# Patient Record
Sex: Female | Born: 1945 | Smoking: Never smoker
Health system: Southern US, Community
[De-identification: ages and names within clinical notes are randomized; demographics above are authoritative.]

## PROBLEM LIST (undated history)

## (undated) DIAGNOSIS — R970 Elevated carcinoembryonic antigen [CEA]: Secondary | ICD-10-CM

## (undated) DIAGNOSIS — M109 Gout, unspecified: Secondary | ICD-10-CM

## (undated) DIAGNOSIS — C189 Malignant neoplasm of colon, unspecified: Secondary | ICD-10-CM

## (undated) DIAGNOSIS — R2242 Localized swelling, mass and lump, left lower limb: Secondary | ICD-10-CM

## (undated) HISTORY — DX: Hypercalcemia: E83.52

## (undated) HISTORY — DX: Malignant neoplasm of colon, unspecified: C18.9

## (undated) HISTORY — DX: Elevated carcinoembryonic antigen (CEA): R97.0

## (undated) HISTORY — DX: Gout, unspecified: M10.9

## (undated) HISTORY — DX: Localized swelling, mass and lump, left lower limb: R22.42

---

## 2011-10-13 ENCOUNTER — Encounter: Payer: Medicare Other | Admitting: Internal Medicine

## 2011-10-13 DIAGNOSIS — Z452 Encounter for adjustment and management of vascular access device: Secondary | ICD-10-CM

## 2011-10-13 DIAGNOSIS — C189 Malignant neoplasm of colon, unspecified: Secondary | ICD-10-CM

## 2011-11-15 ENCOUNTER — Encounter: Payer: Medicare Other | Admitting: Internal Medicine

## 2011-11-15 DIAGNOSIS — C189 Malignant neoplasm of colon, unspecified: Secondary | ICD-10-CM

## 2011-12-26 DIAGNOSIS — Z452 Encounter for adjustment and management of vascular access device: Secondary | ICD-10-CM

## 2011-12-26 DIAGNOSIS — C189 Malignant neoplasm of colon, unspecified: Secondary | ICD-10-CM

## 2012-01-22 DIAGNOSIS — M6281 Muscle weakness (generalized): Secondary | ICD-10-CM

## 2012-03-19 ENCOUNTER — Encounter: Payer: Medicare Other | Admitting: Internal Medicine

## 2012-03-19 DIAGNOSIS — C189 Malignant neoplasm of colon, unspecified: Secondary | ICD-10-CM

## 2012-03-19 DIAGNOSIS — Z452 Encounter for adjustment and management of vascular access device: Secondary | ICD-10-CM

## 2012-04-30 DIAGNOSIS — C189 Malignant neoplasm of colon, unspecified: Secondary | ICD-10-CM

## 2012-04-30 DIAGNOSIS — Z452 Encounter for adjustment and management of vascular access device: Secondary | ICD-10-CM

## 2012-06-11 DIAGNOSIS — Z452 Encounter for adjustment and management of vascular access device: Secondary | ICD-10-CM

## 2012-06-11 DIAGNOSIS — C189 Malignant neoplasm of colon, unspecified: Secondary | ICD-10-CM

## 2012-07-23 ENCOUNTER — Encounter: Payer: Medicare Other | Admitting: Internal Medicine

## 2012-07-23 DIAGNOSIS — K439 Ventral hernia without obstruction or gangrene: Secondary | ICD-10-CM

## 2012-07-23 DIAGNOSIS — C189 Malignant neoplasm of colon, unspecified: Secondary | ICD-10-CM

## 2012-09-03 DIAGNOSIS — Z452 Encounter for adjustment and management of vascular access device: Secondary | ICD-10-CM

## 2012-09-03 DIAGNOSIS — C189 Malignant neoplasm of colon, unspecified: Secondary | ICD-10-CM

## 2012-10-23 DIAGNOSIS — Z452 Encounter for adjustment and management of vascular access device: Secondary | ICD-10-CM

## 2012-10-23 DIAGNOSIS — C189 Malignant neoplasm of colon, unspecified: Secondary | ICD-10-CM

## 2012-12-04 DIAGNOSIS — Z452 Encounter for adjustment and management of vascular access device: Secondary | ICD-10-CM

## 2012-12-04 DIAGNOSIS — C189 Malignant neoplasm of colon, unspecified: Secondary | ICD-10-CM

## 2013-01-23 DIAGNOSIS — C189 Malignant neoplasm of colon, unspecified: Secondary | ICD-10-CM

## 2013-02-28 DIAGNOSIS — R9389 Abnormal findings on diagnostic imaging of other specified body structures: Secondary | ICD-10-CM

## 2013-02-28 DIAGNOSIS — Z85038 Personal history of other malignant neoplasm of large intestine: Secondary | ICD-10-CM

## 2013-02-28 DIAGNOSIS — R970 Elevated carcinoembryonic antigen [CEA]: Secondary | ICD-10-CM

## 2013-02-28 DIAGNOSIS — R944 Abnormal results of kidney function studies: Secondary | ICD-10-CM

## 2013-03-05 DIAGNOSIS — Z452 Encounter for adjustment and management of vascular access device: Secondary | ICD-10-CM

## 2013-03-05 DIAGNOSIS — C189 Malignant neoplasm of colon, unspecified: Secondary | ICD-10-CM

## 2013-03-10 ENCOUNTER — Other Ambulatory Visit (HOSPITAL_COMMUNITY): Payer: Self-pay | Admitting: Hematology and Oncology

## 2013-03-10 DIAGNOSIS — C189 Malignant neoplasm of colon, unspecified: Secondary | ICD-10-CM

## 2013-03-12 ENCOUNTER — Other Ambulatory Visit (HOSPITAL_COMMUNITY): Payer: Self-pay | Admitting: Hematology and Oncology

## 2013-03-12 ENCOUNTER — Encounter (HOSPITAL_COMMUNITY)
Admission: RE | Admit: 2013-03-12 | Discharge: 2013-03-12 | Disposition: A | Discharge status: Home / Self Care | Payer: Medicare Other | Source: Ambulatory Visit | Attending: Hematology and Oncology | Admitting: Hematology and Oncology

## 2013-03-12 DIAGNOSIS — C189 Malignant neoplasm of colon, unspecified: Secondary | ICD-10-CM | POA: Insufficient documentation

## 2013-03-12 MED ORDER — FLUDEOXYGLUCOSE F - 18 (FDG) INJECTION
17.6000 | Freq: Once | INTRAVENOUS | Status: AC | PRN
  Administered 2013-03-12: 17 via INTRAVENOUS

## 2013-03-17 DIAGNOSIS — C189 Malignant neoplasm of colon, unspecified: Secondary | ICD-10-CM

## 2013-03-17 DIAGNOSIS — R229 Localized swelling, mass and lump, unspecified: Secondary | ICD-10-CM

## 2013-03-17 DIAGNOSIS — Z23 Encounter for immunization: Secondary | ICD-10-CM

## 2013-04-24 DIAGNOSIS — Z452 Encounter for adjustment and management of vascular access device: Secondary | ICD-10-CM

## 2013-04-24 DIAGNOSIS — C189 Malignant neoplasm of colon, unspecified: Secondary | ICD-10-CM

## 2015-03-19 ENCOUNTER — Telehealth: Payer: Self-pay | Admitting: General Surgery

## 2015-03-19 NOTE — Telephone Encounter (Signed)
Called patient to give np appt [phone] no answer.

## 2015-03-22 ENCOUNTER — Telehealth: Payer: Self-pay | Admitting: General Surgery

## 2015-03-22 NOTE — Telephone Encounter (Signed)
new patient appt-s/w patient and gave np appt for 11/08 @ 3 w/Dr. Rolanda Jay

## 2015-03-23 ENCOUNTER — Other Ambulatory Visit: Payer: Medicare Other

## 2015-03-23 ENCOUNTER — Inpatient Hospital Stay (HOSPITAL_COMMUNITY)
Admission: EM | Admit: 2015-03-23 | Discharge: 2015-03-26 | DRG: 603 | Disposition: A | Discharge status: Home / Self Care | Payer: Medicare Other | Attending: Family Medicine | Admitting: Family Medicine

## 2015-03-23 ENCOUNTER — Ambulatory Visit (HOSPITAL_BASED_OUTPATIENT_CLINIC_OR_DEPARTMENT_OTHER): Payer: Medicare Other | Admitting: General Surgery

## 2015-03-23 ENCOUNTER — Other Ambulatory Visit: Payer: Self-pay | Admitting: Hematology and Oncology

## 2015-03-23 ENCOUNTER — Encounter (HOSPITAL_COMMUNITY): Payer: Self-pay | Admitting: Emergency Medicine

## 2015-03-23 ENCOUNTER — Ambulatory Visit
Admission: RE | Admit: 2015-03-23 | Discharge: 2015-03-23 | Disposition: A | Discharge status: Home / Self Care | Payer: Self-pay | Source: Ambulatory Visit | Attending: Hematology and Oncology | Admitting: Hematology and Oncology

## 2015-03-23 ENCOUNTER — Encounter: Payer: Self-pay | Admitting: General Surgery

## 2015-03-23 DIAGNOSIS — Z91013 Allergy to seafood: Secondary | ICD-10-CM

## 2015-03-23 DIAGNOSIS — L989 Disorder of the skin and subcutaneous tissue, unspecified: Secondary | ICD-10-CM

## 2015-03-23 DIAGNOSIS — L02416 Cutaneous abscess of left lower limb: Secondary | ICD-10-CM

## 2015-03-23 DIAGNOSIS — Z85038 Personal history of other malignant neoplasm of large intestine: Secondary | ICD-10-CM

## 2015-03-23 DIAGNOSIS — I8392 Asymptomatic varicose veins of left lower extremity: Secondary | ICD-10-CM | POA: Diagnosis present

## 2015-03-23 DIAGNOSIS — L0291 Cutaneous abscess, unspecified: Secondary | ICD-10-CM | POA: Diagnosis present

## 2015-03-23 DIAGNOSIS — L0390 Cellulitis, unspecified: Secondary | ICD-10-CM

## 2015-03-23 DIAGNOSIS — Z7984 Long term (current) use of oral hypoglycemic drugs: Secondary | ICD-10-CM

## 2015-03-23 DIAGNOSIS — E119 Type 2 diabetes mellitus without complications: Secondary | ICD-10-CM | POA: Diagnosis present

## 2015-03-23 DIAGNOSIS — Z6839 Body mass index (BMI) 39.0-39.9, adult: Secondary | ICD-10-CM

## 2015-03-23 DIAGNOSIS — Z7982 Long term (current) use of aspirin: Secondary | ICD-10-CM

## 2015-03-23 LAB — CBC WITH DIFFERENTIAL/PLATELET
BASOS PCT: 1 %
Basophils Absolute: 0 10*3/uL (ref 0.0–0.1)
EOS ABS: 0.4 10*3/uL (ref 0.0–0.7)
Eosinophils Relative: 5 %
HCT: 38.1 % (ref 36.0–46.0)
Hemoglobin: 12.1 g/dL (ref 12.0–15.0)
Lymphocytes Relative: 25 %
Lymphs Abs: 2.1 10*3/uL (ref 0.7–4.0)
MCH: 27.1 pg (ref 26.0–34.0)
MCHC: 31.8 g/dL (ref 30.0–36.0)
MCV: 85.2 fL (ref 78.0–100.0)
MONO ABS: 0.7 10*3/uL (ref 0.1–1.0)
MONOS PCT: 8 %
NEUTROS PCT: 61 %
Neutro Abs: 5.2 10*3/uL (ref 1.7–7.7)
PLATELETS: 497 10*3/uL — AB (ref 150–400)
RBC: 4.47 MIL/uL (ref 3.87–5.11)
RDW: 13.3 % (ref 11.5–15.5)
WBC: 8.4 10*3/uL (ref 4.0–10.5)

## 2015-03-23 LAB — I-STAT CG4 LACTIC ACID, ED: Lactic Acid, Venous: 1.86 mmol/L (ref 0.5–2.0)

## 2015-03-23 MED ORDER — SODIUM CHLORIDE 0.9 % IV BOLUS (SEPSIS)
1000.0000 mL | Freq: Once | INTRAVENOUS | Status: AC
  Administered 2015-03-23: 1000 mL via INTRAVENOUS

## 2015-03-23 MED ORDER — MORPHINE SULFATE (PF) 4 MG/ML IV SOLN
4.0000 mg | Freq: Once | INTRAVENOUS | Status: AC
  Administered 2015-03-23: 4 mg via INTRAVENOUS
  Filled 2015-03-23: qty 1

## 2015-03-23 MED ORDER — ONDANSETRON HCL 4 MG/2ML IJ SOLN
4.0000 mg | Freq: Once | INTRAMUSCULAR | Status: AC
  Administered 2015-03-23: 4 mg via INTRAVENOUS
  Filled 2015-03-23: qty 2

## 2015-03-23 MED ORDER — CLINDAMYCIN PHOSPHATE 600 MG/50ML IV SOLN
600.0000 mg | Freq: Once | INTRAVENOUS | Status: AC
  Administered 2015-03-24: 600 mg via INTRAVENOUS
  Filled 2015-03-23: qty 50

## 2015-03-23 NOTE — ED Notes (Signed)
Patient states that she has an abscess on the back of thigh on the left leg. Patient states that it has burst open. Patient states that the leg is in pain.

## 2015-03-23 NOTE — Patient Instructions (Signed)
We are recommending that you be admitted to the hospital at this time for a significant left thigh abscess.  We have concerns that the infections could turn into sepsis within a short period of time.  Please come to the Emergency Room at [facility] for further evaluation as soon as possible since you have decided to leave and go back to [place] at this time.

## 2015-03-23 NOTE — ED Notes (Signed)
Bed: JF59 Expected date:  Expected time:  Means of arrival:  Comments: HOLD FOR 26

## 2015-03-23 NOTE — Progress Notes (Signed)
[place] NOTE  Patient Care Team: Monico Blitz, MD as PCP - General (Internal Medicine)  Audree Camel, MD  CHIEF COMPLAINTS/PURPOSE OF CONSULTATION:    HISTORY OF PRESENTING ILLNESS:  Laurie Palmer 69 y.o. female is here because of recent diagnosis of a left thigh mass that has been present since 2002. It had been previously biopsied with the diagnosis of only muscle tissue at [facility]. She was then placed in follow-up. She reports this was on the anterior midportion of her thigh and was 4-5 cm in size. 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 In September she had a significant increase in swelling. 2 weeks prior she noticed dramatically bigger with her entire thigh becoming quite swollen. Last week in the left posterior distal thigh she had a skin lesion spontaneously open and begin draining clear, yellow watery discharge without odor. She did have warmth and pain of the left thigh with associated swelling around the ankle. She reports after the spontaneous drainage she had a slight decrease in the size of the mass and decrease in pain. She has had associated subjective fever and chills and the highest temperature taken at home was 99.7. She was seen by her medical oncologist who referred her here for further valuation treatment and he has started her on antibiotics which she's been on for possibly 4 days which was Augmentin. She does report that she is an insulin-dependent diabetic and that in the last week or so she's had more difficulty controlling her blood sugars.  I reviewed her records extensively and collaborated the history with the patient.  SUMMARY OF ONCOLOGIC HISTORY: Colon cancer treated with resection and adjuvant chemotherapy. We do not have the records of her stage or what drug she was treated with.  MEDICAL HISTORY:  Past Medical History  Diagnosis Date  .  Colon cancer ([place])   . Hypercalcemia   . Mass of left thigh   . Elevated carcinoembryonic antigen (CEA)     SURGICAL HISTORY: Colon resection Ventral hernia repair Tonsillectomy  SOCIAL HISTORY: Social History   Social History  . Marital Status: Widowed    Spouse Name: N/A  . Number of Children: N/A  . Years of Education: N/A   Occupational History  . Not on file.   Social History Main Topics  . Smoking status: Not on file  . Smokeless tobacco: Not on file  . Alcohol Use: No  . Drug Use: No  . Sexual Activity: Not on file   Other Topics Concern  . Not on file   Social History Narrative  . No narrative on file    FAMILY HISTORY: Family History  Problem Relation Age of Onset  . Diabetes Mother   . Hypertension Mother   . Prostate cancer Father   . Prostate cancer Brother     ALLERGIES:  is allergic to shellfish allergy.  MEDICATIONS:  Current Outpatient Prescriptions  Medication Sig Dispense Refill  . aspirin (ASPIRIN EC) 81 MG EC tablet Take 81 mg by mouth.    . canagliflozin (INVOKANA) 100 MG TABS tablet Take 100 mg by mouth.    . Cholecalciferol (VITAMIN D3) 1000 UNITS CHEW Chew 1 each by mouth.    Marland Kitchen HYDROcodone-acetaminophen (NORCO/VICODIN) 5-325 MG tablet Take 1 tablet by mouth.    Marland Kitchen ibuprofen (ADVIL,MOTRIN) 800 MG tablet Take 800 mg by mouth.    . insulin aspart (NOVOLOG) 100  UNIT/ML injection Inject into the skin.    Marland Kitchen insulin detemir (LEVEMIR) 100 UNIT/ML injection Inject 10 Units into the skin.    . metFORMIN (GLUCOPHAGE) 500 MG tablet Take 500 mg by mouth.     No current facility-administered medications for this visit.    REVIEW OF SYSTEMS:   Constitutional: She reports subjective fevers, chills  PHYSICAL EXAMINATION: ECOG PERFORMANCE STATUS:   Filed Vitals:   03/23/15 1520  BP: 124/88  Pulse: 96  Temp: 98.4 F (36.9 C)  Resp: 18   Filed Weights   03/23/15 1520  Weight: 261 lb 5 oz (118.531 kg)    GENERAL:alert, no distress and  comfortable SKIN: skin color, texture, turgor are normal, there is redness around the left thigh with associated warmth and induration in the posterior distal thigh. As an open approximately 3 cm wound with spontaneous drainage of yellowish discharge. There is only a slight odor. Musculoskeletal:no cyanosis of digits and no clubbing she does have swelling around the ankle with 1+ edema. She has full range of motion of the left lower extremity. The left thigh is markedly swollen. There is associated warmth over the entire left thigh with significant induration in the posterior distal aspect with an open draining wound as described above. There is a well-healed longitudinally oriented 5 cm scar on the left anterior mid thigh with no associated drainage.  RADIOGRAPHIC STUDIES: I have personally reviewed the radiological reports and agreed with the findings in the report.  There is evidence of inflammation and a large fluid component with peripheral enhancement consistent with possible hemosiderin. His may represent a result hematoma versus abscess. Less likely would be malignancy. I also discussed this with the radiologist.  ASSESSMENT AND PLAN:  Insulin-dependent diabetic patient with a history of left thigh mass now with obvious infection and abscess with open wound and drainage. After a  long discussion with the patient and the friend who accompanied her. I have recommended admission to the hospital with IV antibiotics and reviewed the fact that she needs open drainage and will have an open wound possibly a wound VAC. I emphasized the importance of admission particularly with the size of the abscess and the fact that she is an insulin-dependent diabetic. She has adamant that she needs to go home for personal reasons and wants to return later today or in the morning. She is pushing to come back in the morning. After extended discussion, she is still adamant that she is not going to be admitted at the present  time. I emphasized again the significant risks of sepsis and strongly encouraged her to allow [place] to admit her. She indicated she may come back this evening to the emergency room. As such, we will be notifying the emergency room to alert them of her clinical status and I will notify the surgeon on call so that they are aware of her history in case she comes to the emergency room.  All questions were answered. The patient knows to call the clinic with any problems, questions or concerns.    Frederich Cha, MD 4:03 PM

## 2015-03-23 NOTE — ED Provider Notes (Signed)
CSN: [id number]     Arrival date & time 03/23/15  2147 History  By signing my name below, I, Julien Nordmann, attest that this documentation has been prepared under the direction and in the presence of Delos Haring, PA-C. Electronically Signed: Julien Nordmann, ED Scribe. 03/23/2015. 10:43 PM.    Chief Complaint  Patient presents with  . Abscess    back of left leg     The history is provided by the patient. No language interpreter was used.   HPI Comments: Laurie Palmer is a 69 y.o. female who presents to the Emergency Department for admission as recommended by her oncologist. The patient has had a fine mass since 2012 and appears to have gotten infected recently. She was seen today by Dr. Eddie Dibbles the Oncology General Surgeon regarding the spontaneous drainage and increase in size of the mass recently was clear and yellow fluid that is nonodorous. She is also had a low-grade temp of 99.7.  Patient had been on Augmentin for the past 4 days that was given to her by her medical oncologist. She is an insulin-dependent diabetic that in the past last week or so has had difficulty controlling her blood sugars.  The patient has past medical history of colon cancer treated with resection and adjunct of chemotherapy, there are no records of staging for what drugs she is currently being treated with. Per the surgeon and patient was recommended IV antibiotics, admission and surgical open and drainage with possible wound VAC. The patient declines after significant discussion decided to go home. The patient declined adamantly admission at that time and said she would go to the emergency department either this evening or tomorrow morning. The patient presents to the ER with her sister who says that her and her siblings convinced the patient come back for treatment. The surgeon reports notifying the on-call surgeon the patient may be coming in today.  She denies any change in pain, drainage or swelling  from seeing the surgeon earlier today. She has not had worsened fevers. Denies feeling worse.   Past Medical History  Diagnosis Date  . Colon cancer ([place])   . Hypercalcemia   . Mass of left thigh   . Elevated carcinoembryonic antigen (CEA)    History reviewed. No pertinent past surgical history. Family History  Problem Relation Age of Onset  . Diabetes Mother   . Hypertension Mother   . Prostate cancer Father   . Prostate cancer Brother    Social History  Substance Use Topics  . Smoking status: None  . Smokeless tobacco: None  . Alcohol Use: No   OB History    No data available     Review of Systems  Constitutional: Negative for fever.  Gastrointestinal: Negative for nausea and vomiting.  Skin: Positive for wound.  All other systems reviewed and are negative.     Allergies  Shellfish allergy  Home Medications   Prior to Admission medications   Medication Sig Start Date End Date Taking? Authorizing Provider  aspirin (ASPIRIN EC) 81 MG EC tablet Take 81 mg by mouth.    Historical Provider, MD  canagliflozin (INVOKANA) 100 MG TABS tablet Take 100 mg by mouth.    Historical Provider, MD  Cholecalciferol (VITAMIN D3) 1000 UNITS CHEW Chew 1 each by mouth.    Historical Provider, MD  HYDROcodone-acetaminophen (NORCO/VICODIN) 5-325 MG tablet Take 1 tablet by mouth. 03/11/15 05/10/15  Historical Provider, MD  ibuprofen (ADVIL,MOTRIN) 800 MG tablet Take 800 mg by  mouth.    Historical Provider, MD  insulin aspart (NOVOLOG) 100 UNIT/ML injection Inject into the skin.    Historical Provider, MD  insulin detemir (LEVEMIR) 100 UNIT/ML injection Inject 10 Units into the skin.    Historical Provider, MD  metFORMIN (GLUCOPHAGE) 500 MG tablet Take 500 mg by mouth.    Historical Provider, MD   Triage vitals: BP 140/84 mmHg  Pulse 92  Temp(Src) 98 F (36.7 C) (Oral)  Resp 17  Ht 5' 8.5" (1.74 m)  Wt 261 lb (118.389 kg)  BMI 39.10 kg/m2  SpO2 94% Physical Exam   Constitutional: She appears well-developed and well-nourished. No distress.  HENT:  Head: Normocephalic and atraumatic.  Eyes: Right eye exhibits no discharge. Left eye exhibits no discharge.  Pulmonary/Chest: Effort normal. No respiratory distress.  Neurological: She is alert. Coordination normal.  Skin: No rash noted. She is not diaphoretic.  Left Thigh: Open 3 cm abscess draining some yellow fluid. It is warm and indurated. No red streaking. Associated swelling of the ankle leg.   Psychiatric: She has a normal mood and affect. Her behavior is normal.  Nursing note and vitals reviewed.   ED Course  Procedures  DIAGNOSTIC STUDIES: Oxygen Saturation is 94% on RA, low by my interpretation.  COORDINATION OF CARE:  10:41 PM Discussed treatment plan which includes IV fluids and pain medication with pt at bedside and pt agreed to plan.  Labs Review Labs Reviewed  CBC WITH DIFFERENTIAL/PLATELET - Abnormal; Notable for the following:    Platelets 497 (*)    All other components within normal limits  COMPREHENSIVE METABOLIC PANEL - Abnormal; Notable for the following:    Glucose, Bld 177 (*)    Calcium 10.9 (*)    Total Protein 8.4 (*)    Alkaline Phosphatase 135 (*)    All other components within normal limits  CULTURE, BLOOD (ROUTINE X 2)  CULTURE, BLOOD (ROUTINE X 2)  I-STAT CG4 LACTIC ACID, ED    Imaging Review No results found. I have personally reviewed and evaluated these images and lab results as part of my medical decision-making.   EKG Interpretation None      MDM   Final diagnoses:  Abscess and cellulitis  Abscess of left leg   I discussed the case with Dr. Hassell Done. He has agreed to consult on patient. They will either see her tonight or in the morning. Pt is not acutely ill and her blood-work is encouraging. Will admit to medicine. Lab work is reassuring, no clinical indication of sepsis at this time. Discussed with Dr. Betsey Holiday who has seen her as  well.  Patient admitted to [place], [place], inpatient.  Filed Vitals:   03/23/15 2204  BP: 140/84  Pulse: 92  Temp: 98 F (36.7 C)  Resp: [address] PA-C 03/24/15 0101  Orpah Greek, MD 03/24/15 0104

## 2015-03-24 ENCOUNTER — Encounter (HOSPITAL_COMMUNITY): Payer: Self-pay | Admitting: *Deleted

## 2015-03-24 DIAGNOSIS — E119 Type 2 diabetes mellitus without complications: Secondary | ICD-10-CM | POA: Diagnosis present

## 2015-03-24 DIAGNOSIS — Z6839 Body mass index (BMI) 39.0-39.9, adult: Secondary | ICD-10-CM | POA: Diagnosis not present

## 2015-03-24 DIAGNOSIS — Z91013 Allergy to seafood: Secondary | ICD-10-CM | POA: Diagnosis not present

## 2015-03-24 DIAGNOSIS — Z7984 Long term (current) use of oral hypoglycemic drugs: Secondary | ICD-10-CM | POA: Diagnosis not present

## 2015-03-24 DIAGNOSIS — L0291 Cutaneous abscess, unspecified: Secondary | ICD-10-CM | POA: Diagnosis present

## 2015-03-24 DIAGNOSIS — Z85038 Personal history of other malignant neoplasm of large intestine: Secondary | ICD-10-CM | POA: Diagnosis not present

## 2015-03-24 DIAGNOSIS — L02416 Cutaneous abscess of left lower limb: Principal | ICD-10-CM

## 2015-03-24 DIAGNOSIS — I8392 Asymptomatic varicose veins of left lower extremity: Secondary | ICD-10-CM | POA: Diagnosis present

## 2015-03-24 DIAGNOSIS — Z7982 Long term (current) use of aspirin: Secondary | ICD-10-CM | POA: Diagnosis not present

## 2015-03-24 LAB — COMPREHENSIVE METABOLIC PANEL
ALBUMIN: 3.6 g/dL (ref 3.5–5.0)
ALT: 18 U/L (ref 14–54)
ANION GAP: 9 (ref 5–15)
AST: 18 U/L (ref 15–41)
Alkaline Phosphatase: 135 U/L — ABNORMAL HIGH (ref 38–126)
BUN: 12 mg/dL (ref 6–20)
CO2: 26 mmol/L (ref 22–32)
Calcium: 10.9 mg/dL — ABNORMAL HIGH (ref 8.9–10.3)
Chloride: 104 mmol/L (ref 101–111)
Creatinine, Ser: 0.7 mg/dL (ref 0.44–1.00)
GFR calc Af Amer: >60 mL/min (ref >60)
GFR calc non Af Amer: >60 mL/min (ref >60)
GLUCOSE: 177 mg/dL — AB (ref 65–99)
POTASSIUM: 3.7 mmol/L (ref 3.5–5.1)
SODIUM: 139 mmol/L (ref 135–145)
Total Bilirubin: 0.5 mg/dL (ref 0.3–1.2)
Total Protein: 8.4 g/dL — ABNORMAL HIGH (ref 6.5–8.1)

## 2015-03-24 LAB — GLUCOSE, CAPILLARY
GLUCOSE-CAPILLARY: 116 mg/dL — AB (ref 65–99)
GLUCOSE-CAPILLARY: 168 mg/dL — AB (ref 65–99)
Glucose-Capillary: 130 mg/dL — ABNORMAL HIGH (ref 65–99)
Glucose-Capillary: 132 mg/dL — ABNORMAL HIGH (ref 65–99)

## 2015-03-24 MED ORDER — ENOXAPARIN SODIUM 40 MG/0.4ML ~~LOC~~ SOLN: 40.0000 mg | SUBCUTANEOUS | Status: DC

## 2015-03-24 MED ORDER — INSULIN ASPART 100 UNIT/ML ~~LOC~~ SOLN
0.0000 [IU] | Freq: Three times a day (TID) | SUBCUTANEOUS | Status: DC
  Administered 2015-03-24 – 2015-03-26 (×6): 1 [IU] via SUBCUTANEOUS

## 2015-03-24 MED ORDER — INFLUENZA VAC SPLIT QUAD 0.5 ML IM SUSY: 0.5000 mL | PREFILLED_SYRINGE | INTRAMUSCULAR | Status: DC | PRN

## 2015-03-24 MED ORDER — VANCOMYCIN HCL IN DEXTROSE 1-5 GM/200ML-% IV SOLN
1000.0000 mg | Freq: Two times a day (BID) | INTRAVENOUS | Status: DC
  Administered 2015-03-24 – 2015-03-25 (×2): 1000 mg via INTRAVENOUS
  Filled 2015-03-24 (×3): qty 200

## 2015-03-24 MED ORDER — VANCOMYCIN HCL IN DEXTROSE 750-5 MG/150ML-% IV SOLN
750.0000 mg | Freq: Two times a day (BID) | INTRAVENOUS | Status: DC
  Filled 2015-03-24: qty 150

## 2015-03-24 MED ORDER — ENOXAPARIN SODIUM 60 MG/0.6ML ~~LOC~~ SOLN
60.0000 mg | Freq: Every day | SUBCUTANEOUS | Status: DC
  Administered 2015-03-24: 60 mg via SUBCUTANEOUS
  Filled 2015-03-24: qty 0.6

## 2015-03-24 MED ORDER — MORPHINE SULFATE (PF) 2 MG/ML IV SOLN
1.0000 mg | INTRAVENOUS | Status: DC | PRN
  Administered 2015-03-24 – 2015-03-25 (×4): 1 mg via INTRAVENOUS
  Filled 2015-03-24 (×4): qty 1

## 2015-03-24 MED ORDER — VANCOMYCIN HCL 10 G IV SOLR
2500.0000 mg | Freq: Once | INTRAVENOUS | Status: AC
  Administered 2015-03-24: 2500 mg via INTRAVENOUS
  Filled 2015-03-24 (×2): qty 2500

## 2015-03-24 MED ORDER — ASPIRIN EC 81 MG PO TBEC
81.0000 mg | DELAYED_RELEASE_TABLET | Freq: Every day | ORAL | Status: DC
  Administered 2015-03-24: 81 mg via ORAL
  Filled 2015-03-24: qty 1

## 2015-03-24 MED ORDER — HYDROMORPHONE HCL 1 MG/ML IJ SOLN
0.5000 mg | INTRAMUSCULAR | Status: AC | PRN
Start: 2015-03-24 — End: 2015-03-24

## 2015-03-24 MED ORDER — PIPERACILLIN-TAZOBACTAM 3.375 G IVPB
3.3750 g | Freq: Once | INTRAVENOUS | Status: AC
  Administered 2015-03-24: 3.375 g via INTRAVENOUS
  Filled 2015-03-24: qty 50

## 2015-03-24 MED ORDER — INSULIN DETEMIR 100 UNIT/ML ~~LOC~~ SOLN
10.0000 [IU] | Freq: Every day | SUBCUTANEOUS | Status: DC
  Administered 2015-03-24 – 2015-03-25 (×2): 10 [IU] via SUBCUTANEOUS
  Filled 2015-03-24 (×4): qty 0.1

## 2015-03-24 MED ORDER — PIPERACILLIN-TAZOBACTAM 3.375 G IVPB
3.3750 g | Freq: Three times a day (TID) | INTRAVENOUS | Status: DC
Start: 2015-03-24 — End: 2015-03-26
  Administered 2015-03-24 – 2015-03-26 (×6): 3.375 g via INTRAVENOUS
  Filled 2015-03-24 (×8): qty 50

## 2015-03-24 NOTE — Progress Notes (Signed)
Utilization review completed.  Awaiting surgical consult

## 2015-03-24 NOTE — Progress Notes (Signed)
Brief Pharmacy Note:  Pharmacy Consult for Zosyn/Vancomycin Indication: Wound infection  See note by Lawana Pai, RPh, for full details. In brief, this is a 42 y/oF currently being treated with Vancomycin and Zosyn for left thigh abscess. Patient is scheduled for I&D of abscess with biopsies on 11/10.  Plan:  Increase maintenance dose of Vancomycin to 1g IV q12h for updated weight per nomogram.  Plan for Vancomycin trough level at steady state.  Continue Zosyn 3.375g IV q8h (infuse over 4 hours)  Monitor renal function, cultures, clinical course.    Lindell Spar, PharmD, BCPS Pager: [phone] 03/24/2015 3:36 PM

## 2015-03-24 NOTE — Progress Notes (Signed)
  3:46 PM I agree with HPI/GPe and A/P per Dr. 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 Abscess 03/24/2015  . Abscess of left leg 03/23/2015   Appreciate surgery inoput NPO after midnight PRe-op orders as per Dr. Rolanda Jay We will follow and assist as needed  Verneita Griffes, MD [facility] (P) [phone]

## 2015-03-24 NOTE — Progress Notes (Addendum)
ANTIBIOTIC CONSULT NOTE - INITIAL  Pharmacy Consult for Zosyn/Vancomycin Indication: Wound infection  Allergies  Allergen Reactions  . Shellfish Allergy Swelling and Anaphylaxis    Patient Measurements: Height: 5' 8.5" (174 cm) Weight: 261 lb (118.389 kg) IBW/kg (Calculated) : 65.05   Vital Signs: Temp: 98 F (36.7 C) (11/08 2204) Temp Source: Oral (11/08 2204) BP: 112/63 mmHg (11/09 0122) Pulse Rate: 77 (11/09 0122) Intake/Output from previous day:   Intake/Output from this shift:    Labs:  Recent Labs  03/23/15 2314  WBC 8.4  HGB 12.1  PLT 497*  CREATININE 0.70   Estimated Creatinine Clearance: 90.5 mL/min (by C-G formula based on Cr of 0.7). No results for input(s): VANCOTROUGH, VANCOPEAK, VANCORANDOM, GENTTROUGH, GENTPEAK, GENTRANDOM, TOBRATROUGH, TOBRAPEAK, TOBRARND, AMIKACINPEAK, AMIKACINTROU, AMIKACIN in the last 72 hours.   Microbiology: No results found for this or any previous visit (from the past 720 hour(s)).  Medical History: Past Medical History  Diagnosis Date  . Colon cancer ([place])   . Hypercalcemia   . Mass of left thigh   . Elevated carcinoembryonic antigen (CEA)     Medications:   (Not in a hospital admission) Scheduled:  . aspirin  81 mg Oral Daily  . enoxaparin (LOVENOX) injection  40 mg Subcutaneous Q24H  . insulin aspart  0-9 Units Subcutaneous TID WC  . insulin detemir  10 Units Subcutaneous QHS   Infusions:  . piperacillin-tazobactam (ZOSYN)  IV    . vancomycin     Assessment: 89 yoF c/o pain in left thigh.  Left posterior thigh fluid collection concerning for possible abscess.  Zosyn/Vancomycin per Rx for wound infection.   Goal of Therapy:  Vancomycin trough level 15-20 mcg/ml  Plan:   Zosyn 3.375 Gm IV q8h EI  Vancomycin 2500mg  x1 then 750mg  IV q12h  Adjust Lovenox to 60mg  daily in pt with BMI>30  F/u SCr/cultures/levels as needed  Lawana Pai R 03/24/2015,2:18 AM

## 2015-03-24 NOTE — ED Notes (Signed)
Tried to call report. RN unavailable. 

## 2015-03-24 NOTE — H&P (Signed)
History and Physical  JANEAL ABADI EPP:[id number] DOB: Jan 13, 1946 DOA: 03/23/2015  PCP: Monico Blitz, MD   Chief Complaint: pain in left thigh  History of Present Illness:  - Patient is a 69 yo female with history of DM, colon cancer and a persistent left thigh mass since 2002.  - She has been having increase in the size of the mass since Feb with no fever, chills, fatigue, weight loss or any other symptoms except for occasional pain in the thigh posteriorly. Only last weekend it started oozing through the skin.  - She denies history of trauma or poking to the site. She was given Augmentin last week after she had MRI of left thigh that suggested a "fluid collection" that was worrisome of an abscess. Then she had spontaneous drainage last weekend. Today she had dressing applied to it in the cancer center. Dr.Darovsky tried to convince patient to come to ER to be seen by surgery but initially she refused then she came back last evening.  - She is complaining of pain at the site and oozing with no other complaints. She could not tell if the skin was erythematous but she felt it was warm to touch. She denies any joints pain, including left knee pain.  - I don't have access to MRI results.   Review of Systems:  CONSTITUTIONAL:  No night sweats.  No fatigue, malaise, lethargy.  No fever or chills. Eyes:  No visual changes.  No eye pain.  No eye discharge.   ENT:    No epistaxis.  No sinus pain.  No sore throat.  No ear pain.  No congestion. RESPIRATORY:  No cough.  No wheeze.  No hemoptysis.  No shortness of breath. CARDIOVASCULAR:  No chest pains.  No palpitations. GASTROINTESTINAL:  No abdominal pain.  No nausea or vomiting.  No diarrhea or constipation.  No hematemesis.  No hematochezia.  No melena. GENITOURINARY:  No urgency.  No frequency.  No dysuria.  No hematuria.  No obstructive symptoms.  No discharge.  No pain.  No significant abnormal bleeding. MUSCULOSKELETAL:   +musculoskeletal pain.  No joint swelling.  No arthritis. NEUROLOGICAL:  No confusion.  No weakness. No headache. No seizure. PSYCHIATRIC:  No depression. No anxiety. No suicidal ideation. SKIN:  No rashes.  +lesions.  +wounds. ENDOCRINE:  No unexplained weight loss.  No polydipsia.  No polyuria.  No polyphagia. HEMATOLOGIC:  No anemia.  No purpura.  No petechiae.  No bleeding.  ALLERGIC AND IMMUNOLOGIC:  No pruritus.  No swelling Other:  Past Medical and Surgical History:   Past Medical History  Diagnosis Date  . Colon cancer ([place])   . Hypercalcemia   . Mass of left thigh   . Elevated carcinoembryonic antigen (CEA)    History reviewed. No pertinent past surgical history.  Social History:   reports that she does not drink alcohol or use illicit drugs. Her tobacco history is not on file.   Allergies  Allergen Reactions  . Shellfish Allergy Swelling and Anaphylaxis    Family History  Problem Relation Age of Onset  . Diabetes Mother   . Hypertension Mother   . Prostate cancer Father   . Prostate cancer Brother       Prior to Admission medications   Medication Sig Start Date End Date Taking? Authorizing Provider  aspirin (ASPIRIN EC) 81 MG EC tablet Take 81 mg by mouth.    Historical Provider, MD  canagliflozin (INVOKANA) 100 MG TABS tablet Take 100 mg by  mouth.    Historical Provider, MD  Cholecalciferol (VITAMIN D3) 1000 UNITS CHEW Chew 1 each by mouth.    Historical Provider, MD  HYDROcodone-acetaminophen (NORCO/VICODIN) 5-325 MG tablet Take 1 tablet by mouth. 03/11/15 05/10/15  Historical Provider, MD  ibuprofen (ADVIL,MOTRIN) 800 MG tablet Take 800 mg by mouth.    Historical Provider, MD  insulin aspart (NOVOLOG) 100 UNIT/ML injection Inject into the skin.    Historical Provider, MD  insulin detemir (LEVEMIR) 100 UNIT/ML injection Inject 10 Units into the skin.    Historical Provider, MD  metFORMIN (GLUCOPHAGE) 500 MG tablet Take 500 mg by mouth.    Historical  Provider, MD    Physical Exam: BP 140/84 mmHg  Pulse 92  Temp(Src) 98 F (36.7 C) (Oral)  Resp 17  Ht 5' 8.5" (1.74 m)  Wt 118.389 kg (261 lb)  BMI 39.10 kg/m2  SpO2 94%  GENERAL : Well developed, well nourished, alert and cooperative, and appears to be in no acute distress. HEAD: normocephalic. EYES: PERRL, EOMI..vision is grossly intact. EARS:  hearing grossly intact. NOSE: No nasal discharge. THROAT: Oral cavity and pharynx normal.   NECK: Neck supple, CARDIAC: Normal S1 and S2. No S3, S4 or murmurs. Rhythm is regular. There is no peripheral edema, cyanosis or pallor. Extremities are warm and well perfused. No carotid bruits. LUNGS: Clear to auscultation and percussion without rales, rhonchi, wheezing or diminished breath sounds. ABDOMEN: Positive bowel sounds. Soft, nondistended, nontender. No guarding or rebound. No masses. EXTREMITIES:  NEUROLOGICAL: The mental examination revealed the patient was oriented to person, place, and time.CN II-XII intact. Strength and sensation symmetric and intact throughout.  SKIN: erythematous skin with 5*4 cm wound oozing clear fluid with white tissue protruding. No tenderness to touch.  PSYCHIATRIC:  The patient was able to demonstrate good judgement and reason, without hallucinations, abnormal affect or abnormal behaviors during the examination. Patient is not suicidal.          Labs on Admission:  Reviewed.   Radiological Exams on Admission: No results found.    Assessment/Plan  Left posterior thigh fluid collection concerning for a possible abscess:  Will start vanc/zosyn , send for bcx. ( she got clindamycin in the ER) Will check MRI report done as an outpatient tomorrow Consult general surgery in am for I&D Wound care consult PT consult Morphine prn pain.   DM: continue Levemir and start low dose correction insulin. Hold PO med for now   DVT prophylaxis: Roberts enoxaparin  Consultants: Gen surgery  Code Status: full      Gennaro Africa M.D [facility]

## 2015-03-24 NOTE — Care Management Note (Signed)
Case Management Note  Patient Details  Name: Laurie Palmer MRN: [id number] Date of Birth: [birth year]  Subjective/Objective:         69 yo admitted with Abscess of Left leg           Action/Plan: From home alone  Expected Discharge Date:                  Expected Discharge Plan:  [place]  In-House Referral:     Discharge planning Services  CM Consult  Post Acute Care Choice:  Home Health Choice offered to:  Patient  DME Arranged:  Vac DME Agency:     HH Arranged:    HH Agency:     Status of Service:  In process, will continue to follow  Medicare Important Message Given:    Date Medicare IM Given:    Medicare IM give by:    Date Additional Medicare IM Given:    Additional Medicare Important Message give by:     If discussed at [place] of Stay Meetings, dates discussed:    Additional Comments: MD consult for home health needs. Pt to have VAC placed tomorrow. This CM met with pt at bedside to offer choice for [facility] for home VAC dressing changes. Pt from [facility]. Pt would like to look over [facility] Provider list and think about decision. Wound VAC dressing order form placed on shadow chart for MD signature. Once signature is done CM will fax order to [facility] rep. Staff RN informed that Vac order form needs to be signed by MD. CM will continue to follow. Lynnell Catalan, RN 03/24/2015, 3:26 PM

## 2015-03-24 NOTE — Consult Note (Signed)
Patient well-known to me, she was seen yesterday in clinic and diagnosed with a spontaneously draining left thigh abscess. She declined admission at that point and needed to return home. She is now been admitted and started on antibiotics. She has been on a diet. I discussed at length with the hospitalist who agrees that this can be handled here and no other consults are necessary at this point. I reviewed this with the patient she understands and agrees. She to would prefer to be treated here. As such we will plan to make her nothing by mouth after midnight. Hold her next dose of Lovenox. We will plan to take her to the operating room tomorrow for incision and drainage of left thigh abscess with biopsies. We will ultimately place a wound VAC. She understands this will require prolonged period of recuperation and wound VAC changes. Risk and benefits were reviewed at length including bleeding infection and need for further surgery. She also understands the possibility this could be malignant in which case she may require additional resection and/or radiation therapy.

## 2015-03-25 ENCOUNTER — Encounter (HOSPITAL_COMMUNITY): Payer: Self-pay | Admitting: Anesthesiology

## 2015-03-25 ENCOUNTER — Encounter (HOSPITAL_COMMUNITY)
Admission: EM | Disposition: A | Discharge status: Home / Self Care | Payer: Self-pay | Source: Home / Self Care | Attending: Family Medicine

## 2015-03-25 ENCOUNTER — Inpatient Hospital Stay (HOSPITAL_COMMUNITY): Payer: Medicare Other | Admitting: Anesthesiology

## 2015-03-25 DIAGNOSIS — L02416 Cutaneous abscess of left lower limb: Secondary | ICD-10-CM | POA: Diagnosis not present

## 2015-03-25 HISTORY — PX: IRRIGATION AND DEBRIDEMENT ABSCESS: SHX5252

## 2015-03-25 LAB — BASIC METABOLIC PANEL
ANION GAP: 5 (ref 5–15)
BUN: 9 mg/dL (ref 6–20)
CALCIUM: 10 mg/dL (ref 8.9–10.3)
CO2: 30 mmol/L (ref 22–32)
CREATININE: 0.66 mg/dL (ref 0.44–1.00)
Chloride: 102 mmol/L (ref 101–111)
Glucose, Bld: 135 mg/dL — ABNORMAL HIGH (ref 65–99)
Potassium: 4.2 mmol/L (ref 3.5–5.1)
SODIUM: 137 mmol/L (ref 135–145)

## 2015-03-25 LAB — CBC
HEMATOCRIT: 38.1 % (ref 36.0–46.0)
Hemoglobin: 11.8 g/dL — ABNORMAL LOW (ref 12.0–15.0)
MCH: 27 pg (ref 26.0–34.0)
MCHC: 31 g/dL (ref 30.0–36.0)
MCV: 87.2 fL (ref 78.0–100.0)
Platelets: 454 10*3/uL — ABNORMAL HIGH (ref 150–400)
RBC: 4.37 MIL/uL (ref 3.87–5.11)
RDW: 13.7 % (ref 11.5–15.5)
WBC: 7.4 10*3/uL (ref 4.0–10.5)

## 2015-03-25 LAB — GLUCOSE, CAPILLARY
GLUCOSE-CAPILLARY: 126 mg/dL — AB (ref 65–99)
GLUCOSE-CAPILLARY: 150 mg/dL — AB (ref 65–99)
Glucose-Capillary: 120 mg/dL — ABNORMAL HIGH (ref 65–99)
Glucose-Capillary: 129 mg/dL — ABNORMAL HIGH (ref 65–99)
Glucose-Capillary: 158 mg/dL — ABNORMAL HIGH (ref 65–99)

## 2015-03-25 LAB — SURGICAL PCR SCREEN
MRSA, PCR: POSITIVE — AB
STAPHYLOCOCCUS AUREUS: POSITIVE — AB

## 2015-03-25 SURGERY — IRRIGATION AND DEBRIDEMENT ABSCESS
Anesthesia: General | Site: Thigh | Laterality: Left

## 2015-03-25 MED ORDER — MUPIROCIN 2 % EX OINT
1.0000 "application " | TOPICAL_OINTMENT | Freq: Two times a day (BID) | CUTANEOUS | Status: DC
  Administered 2015-03-25 – 2015-03-26 (×2): 1 via NASAL

## 2015-03-25 MED ORDER — OXYCODONE HCL 5 MG PO TABS: 5.0000 mg | ORAL_TABLET | Freq: Once | ORAL | Status: DC | PRN

## 2015-03-25 MED ORDER — FENTANYL CITRATE (PF) 100 MCG/2ML IJ SOLN
INTRAMUSCULAR | Status: DC | PRN
  Administered 2015-03-25: 50 ug via INTRAVENOUS
  Administered 2015-03-25: 150 ug via INTRAVENOUS
  Administered 2015-03-25: 50 ug via INTRAVENOUS

## 2015-03-25 MED ORDER — METOCLOPRAMIDE HCL 5 MG/ML IJ SOLN
INTRAMUSCULAR | Status: DC | PRN
  Administered 2015-03-25: 10 mg via INTRAVENOUS

## 2015-03-25 MED ORDER — METOCLOPRAMIDE HCL 5 MG/ML IJ SOLN
INTRAMUSCULAR | Status: AC
  Filled 2015-03-25: qty 2

## 2015-03-25 MED ORDER — PROPOFOL 10 MG/ML IV BOLUS
INTRAVENOUS | Status: DC | PRN
  Administered 2015-03-25: 140 mg via INTRAVENOUS

## 2015-03-25 MED ORDER — HYDROMORPHONE HCL 1 MG/ML IJ SOLN
0.2500 mg | INTRAMUSCULAR | Status: DC | PRN
  Administered 2015-03-25 (×4): 0.5 mg via INTRAVENOUS

## 2015-03-25 MED ORDER — CHLORHEXIDINE GLUCONATE CLOTH 2 % EX PADS
6.0000 | MEDICATED_PAD | Freq: Every day | CUTANEOUS | Status: DC
  Administered 2015-03-25 – 2015-03-26 (×2): 6 via TOPICAL

## 2015-03-25 MED ORDER — LACTATED RINGERS IV SOLN
INTRAVENOUS | Status: DC
  Administered 2015-03-25 (×2): via INTRAVENOUS
  Administered 2015-03-25: 1000 mL via INTRAVENOUS

## 2015-03-25 MED ORDER — LIDOCAINE HCL (CARDIAC) 20 MG/ML IV SOLN
INTRAVENOUS | Status: DC | PRN
  Administered 2015-03-25: 50 mg via INTRAVENOUS

## 2015-03-25 MED ORDER — SUGAMMADEX SODIUM 500 MG/5ML IV SOLN
INTRAVENOUS | Status: DC | PRN
  Administered 2015-03-25: 300 mg via INTRAVENOUS

## 2015-03-25 MED ORDER — PROPOFOL 10 MG/ML IV BOLUS
INTRAVENOUS | Status: AC
  Filled 2015-03-25: qty 20

## 2015-03-25 MED ORDER — ENOXAPARIN SODIUM 60 MG/0.6ML ~~LOC~~ SOLN
60.0000 mg | SUBCUTANEOUS | Status: DC
  Administered 2015-03-25: 60 mg via SUBCUTANEOUS
  Filled 2015-03-25 (×2): qty 0.6

## 2015-03-25 MED ORDER — HYDROMORPHONE HCL 1 MG/ML IJ SOLN
INTRAMUSCULAR | Status: AC
  Filled 2015-03-25: qty 1

## 2015-03-25 MED ORDER — ROCURONIUM BROMIDE 100 MG/10ML IV SOLN
INTRAVENOUS | Status: DC | PRN
  Administered 2015-03-25: 20 mg via INTRAVENOUS

## 2015-03-25 MED ORDER — SUGAMMADEX SODIUM 200 MG/2ML IV SOLN
INTRAVENOUS | Status: AC
  Filled 2015-03-25: qty 2

## 2015-03-25 MED ORDER — FENTANYL CITRATE (PF) 250 MCG/5ML IJ SOLN
INTRAMUSCULAR | Status: AC
Start: 2015-03-25 — End: 2015-03-25
  Filled 2015-03-25: qty 25

## 2015-03-25 MED ORDER — ACETAMINOPHEN 325 MG PO TABS: 325.0000 mg | ORAL_TABLET | ORAL | Status: DC | PRN

## 2015-03-25 MED ORDER — 0.9 % SODIUM CHLORIDE (POUR BTL) OPTIME
TOPICAL | Status: DC | PRN
Start: 2015-03-25 — End: 2015-03-25
  Administered 2015-03-25: 1000 mL

## 2015-03-25 MED ORDER — ONDANSETRON HCL 4 MG/2ML IJ SOLN
INTRAMUSCULAR | Status: AC
  Filled 2015-03-25: qty 2

## 2015-03-25 MED ORDER — SUCCINYLCHOLINE CHLORIDE 20 MG/ML IJ SOLN
INTRAMUSCULAR | Status: DC | PRN
  Administered 2015-03-25: 100 mg via INTRAVENOUS

## 2015-03-25 MED ORDER — MUPIROCIN 2 % EX OINT
TOPICAL_OINTMENT | CUTANEOUS | Status: AC
  Administered 2015-03-25: 10:00:00
  Filled 2015-03-25: qty 22

## 2015-03-25 MED ORDER — ACETAMINOPHEN 160 MG/5ML PO SOLN: 325.0000 mg | ORAL | Status: DC | PRN

## 2015-03-25 MED ORDER — OXYCODONE-ACETAMINOPHEN 5-325 MG PO TABS
2.0000 | ORAL_TABLET | ORAL | Status: DC | PRN
Start: 2015-03-25 — End: 2015-03-26

## 2015-03-25 MED ORDER — ONDANSETRON HCL 4 MG/2ML IJ SOLN
INTRAMUSCULAR | Status: DC | PRN
  Administered 2015-03-25: 4 mg via INTRAVENOUS

## 2015-03-25 MED ORDER — PHENYLEPHRINE 40 MCG/ML (10ML) SYRINGE FOR IV PUSH (FOR BLOOD PRESSURE SUPPORT)
PREFILLED_SYRINGE | INTRAVENOUS | Status: AC
  Filled 2015-03-25: qty 10

## 2015-03-25 MED ORDER — OXYCODONE HCL 5 MG/5ML PO SOLN: 5.0000 mg | Freq: Once | ORAL | Status: DC | PRN

## 2015-03-25 MED ORDER — PHENYLEPHRINE HCL 10 MG/ML IJ SOLN
INTRAMUSCULAR | Status: DC | PRN
  Administered 2015-03-25: 80 ug via INTRAVENOUS

## 2015-03-25 MED ORDER — SODIUM CHLORIDE 0.9 % IR SOLN
Status: DC | PRN
  Administered 2015-03-25: 3000 mL

## 2015-03-25 SURGICAL SUPPLY — 39 items
BENZOIN TINCTURE PRP APPL 2/3 (GAUZE/BANDAGES/DRESSINGS) IMPLANT
BLADE HEX COATED 2.75 (ELECTRODE) ×3 IMPLANT
BLADE SURG 15 STRL LF DISP TIS (BLADE) ×1 IMPLANT
BLADE SURG 15 STRL SS (BLADE) ×2
BLADE SURG SZ10 CARB STEEL (BLADE) ×3 IMPLANT
CLOSURE WOUND 1/2 X4 (GAUZE/BANDAGES/DRESSINGS)
COVER SURGICAL LIGHT HANDLE (MISCELLANEOUS) ×3 IMPLANT
DECANTER SPIKE VIAL GLASS SM (MISCELLANEOUS) IMPLANT
DRAPE LAPAROSCOPIC ABDOMINAL (DRAPES) ×3 IMPLANT
DRAPE LAPAROTOMY T 102X78X121 (DRAPES) IMPLANT
DRAPE LAPAROTOMY TRNSV 102X78 (DRAPE) IMPLANT
DRAPE POUCH INSTRU U-SHP 10X18 (DRAPES) IMPLANT
DRAPE SHEET LG 3/4 BI-LAMINATE (DRAPES) IMPLANT
ELECT PENCIL ROCKER SW 15FT (MISCELLANEOUS) ×6 IMPLANT
ELECT REM PT RETURN 9FT ADLT (ELECTROSURGICAL) ×3
ELECTRODE REM PT RTRN 9FT ADLT (ELECTROSURGICAL) ×1 IMPLANT
EVACUATOR SILICONE 100CC (DRAIN) IMPLANT
GAUZE SPONGE 4X4 12PLY STRL (GAUZE/BANDAGES/DRESSINGS) ×6 IMPLANT
GLOVE BIOGEL PI IND STRL 7.0 (GLOVE) ×1 IMPLANT
GLOVE BIOGEL PI INDICATOR 7.0 (GLOVE) ×2
GLOVE EUDERMIC 7 POWDERFREE (GLOVE) ×3 IMPLANT
GOWN STRL REUS W/TWL LRG LVL3 (GOWN DISPOSABLE) ×3 IMPLANT
GOWN STRL REUS W/TWL XL LVL3 (GOWN DISPOSABLE) ×6 IMPLANT
KIT BASIN OR (CUSTOM PROCEDURE TRAY) ×3 IMPLANT
NEEDLE HYPO 25X1 1.5 SAFETY (NEEDLE) IMPLANT
NS IRRIG 1000ML POUR BTL (IV SOLUTION) IMPLANT
PACK BASIC VI WITH GOWN DISP (CUSTOM PROCEDURE TRAY) ×3 IMPLANT
PEN SKIN MARKING BROAD (MISCELLANEOUS) IMPLANT
SOL PREP POV-IOD 4OZ 10% (MISCELLANEOUS) ×3 IMPLANT
SPONGE LAP 18X18 X RAY DECT (DISPOSABLE) ×3 IMPLANT
SPONGE LAP 4X18 X RAY DECT (DISPOSABLE) ×3 IMPLANT
STAPLER VISISTAT 35W (STAPLE) IMPLANT
STRIP CLOSURE SKIN 1/2X4 (GAUZE/BANDAGES/DRESSINGS) IMPLANT
SUT MNCRL AB 4-0 PS2 18 (SUTURE) IMPLANT
SUT VIC AB 3-0 SH 18 (SUTURE) IMPLANT
SYR CONTROL 10ML LL (SYRINGE) ×3 IMPLANT
TOWEL OR 17X26 10 PK STRL BLUE (TOWEL DISPOSABLE) ×3 IMPLANT
WATER STERILE IRR 1500ML POUR (IV SOLUTION) IMPLANT
YANKAUER SUCT BULB TIP 10FT TU (MISCELLANEOUS) IMPLANT

## 2015-03-25 NOTE — Progress Notes (Signed)
Pt seen this am.  NPO since yest 6pm. No new complaints. All questions answered. Plan I&D today.

## 2015-03-25 NOTE — Interval H&P Note (Signed)
History and Physical Interval Note:  03/25/2015 10:24 AM  Laurie Palmer  has presented today for surgery, with the diagnosis of abcess left thigh  The various methods of treatment have been discussed with the patient and family. After consideration of risks, benefits and other options for treatment, the patient has consented to  Procedure(s): IRRIGATION AND DEBRIDEMENT ABSCESS LEFT THIGH WITH BIOPSIES AND WOUND [name] (Left) as a surgical intervention .  The patient's history has been reviewed, patient examined, no change in status, stable for surgery.  I have reviewed the patient's chart and labs.  Questions were answered to the patient's satisfaction.     Frederich Cha

## 2015-03-25 NOTE — Anesthesia Preprocedure Evaluation (Signed)
Anesthesia Evaluation  Patient identified by MRN, date of birth, ID band Patient awake    Reviewed: Allergy & Precautions, NPO status , Patient's Chart, lab work & pertinent test results  History of Anesthesia Complications Negative for: history of anesthetic complications  Airway Mallampati: I  TM Distance: >3 FB Neck ROM: Full    Dental  (+) Teeth Intact, Missing   Pulmonary neg pulmonary ROS,    breath sounds clear to auscultation       Cardiovascular negative cardio ROS   Rhythm:Regular     Neuro/Psych negative neurological ROS  negative psych ROS   GI/Hepatic negative GI ROS, Neg liver ROS,   Endo/Other  diabetes, Type 2, Insulin DependentMorbid obesity  Renal/GU negative Renal ROS     Musculoskeletal   Abdominal   Peds  Hematology negative hematology ROS (+)   Anesthesia Other Findings   Reproductive/Obstetrics                             Anesthesia Physical Anesthesia Plan  ASA: III  Anesthesia Plan: General   Post-op Pain Management:    Induction: Intravenous  Airway Management Planned: Oral ETT  Additional Equipment: None  Intra-op Plan:   Post-operative Plan: Extubation in OR  Informed Consent: I have reviewed the patients History and Physical, chart, labs and discussed the procedure including the risks, benefits and alternatives for the proposed anesthesia with the patient or authorized representative who has indicated his/her understanding and acceptance.   Dental advisory given  Plan Discussed with: CRNA and Surgeon  Anesthesia Plan Comments:         Anesthesia Quick Evaluation

## 2015-03-25 NOTE — Anesthesia Postprocedure Evaluation (Signed)
  Anesthesia Post-op Note  Patient: Laurie Palmer  Procedure(s) Performed: Procedure(s): IRRIGATION AND DEBRIDEMENT ABSCESS LEFT THIGH WITH BIOPSIES AND WOUND VAC PLACEMENT (Left)  Patient Location: PACU  Anesthesia Type:General  Level of Consciousness: awake  Airway and Oxygen Therapy: Patient Spontanous Breathing and Patient connected to nasal cannula oxygen  Post-op Pain: mild  Post-op Assessment: Post-op Vital signs reviewed, Patient's Cardiovascular Status Stable, Respiratory Function Stable, Patent Airway, No signs of Nausea or vomiting and Pain level controlled              Post-op Vital Signs: Reviewed and stable  Last Vitals:  Filed Vitals:   03/25/15 1304  BP: 123/71  Pulse: 70  Temp: 36.5 C  Resp: 18    Complications: No apparent anesthesia complications

## 2015-03-25 NOTE — Transfer of Care (Signed)
Immediate Anesthesia Transfer of Care Note  Patient: Laurie Palmer  Procedure(s) Performed: Procedure(s): IRRIGATION AND DEBRIDEMENT ABSCESS LEFT THIGH WITH BIOPSIES AND WOUND VAC PLACEMENT (Left)  Patient Location: PACU  Anesthesia Type:General  Level of Consciousness:  sedated, patient cooperative and responds to stimulation  Airway & Oxygen Therapy:Patient Spontanous Breathing and Patient connected to face mask oxgen  Post-op Assessment:  Report given to PACU RN and Post -op Vital signs reviewed and stable  Post vital signs:  Reviewed and stable  Last Vitals:  Filed Vitals:   03/25/15 1220  BP: 131/72  Pulse: 79  Temp:   Resp: 21    Complications: No apparent anesthesia complications

## 2015-03-25 NOTE — Anesthesia Procedure Notes (Signed)
Procedure Name: Intubation Date/Time: 03/25/2015 11:15 AM Performed by: Roshni Burbano, Virgel Gess Pre-anesthesia Checklist: Patient identified, Emergency Drugs available, Suction available, Patient being monitored and Timeout performed Patient Re-evaluated:Patient Re-evaluated prior to inductionOxygen Delivery Method: Circle system utilized Preoxygenation: Pre-oxygenation with 100% oxygen Intubation Type: IV induction Ventilation: Mask ventilation without difficulty Laryngoscope Size: Mac and 4 Grade View: Grade I Tube type: Oral Tube size: 7.5 mm Number of attempts: 1 Airway Equipment and Method: Stylet Placement Confirmation: ETT inserted through vocal cords under direct vision,  positive ETCO2,  CO2 detector and breath sounds checked- equal and bilateral Secured at: 22 cm Tube secured with: Tape Dental Injury: Teeth and Oropharynx as per pre-operative assessment

## 2015-03-25 NOTE — Op Note (Signed)
   Pre-operative Diagnosis: Left Leg Thigh Abscess   Procedure: Incision and Drainage of Left thigh Abscess Incisional Biopsy Left Thigh Wound vac placement  Post-operative Diagnosis: Same   Surgeon: Malachi Bonds  Assistants:  None  Anesthesia:  General Endotracheal  IVF: 500  EBL: 50  Drains: Wound Vac  Complications: None; patient tolerated the procedure well.   Disposition: PACU - hemodynamically stable.   Condition: stable   Indication for Procedure: Patient is a 69 yo with history of soft tissue mass over several years.  Recent strain when lifting and over last few weeks dramatic swelling.  Over the last week she has developed open drainage and subjective fever and chills.  Plan is for incision and drainage with wound vac placement and biopsies.  Operative Findings: No communication to large cavity.  Final wound size 12cm x 7 cm x 2 cm.    Procedure in Detail: After informed consent was obtained the patient was identified and taken to the operating room. She was then placed in the prone position. There was a 4 cm x 4 cm necrotic wound on the posterior distal left thigh. After timeout was performed the left thigh which a been marked verified with the patient preoperatively was incised with a longitudinally oriented incision that was fashioned into a lips to incorporate the entire necrotic wound. This was dissected circumferentially and then delivered as specimen. A portion was sent for tissue culture and the remaining portion was sent for permanent. The wound was explored extensively and found not to be in communication with a larger cavity. This was felt to be separate from the large fluid collection seen on the MRI scan. The wound was Pulsavac irrigated with copious amounts of warm water irrigation. There were a number of varicose veins which were cauterized. After hemostasis was ensured a wound VAC was placed. He was found have a good seal. At the end of the case  sponge and needle counts were reported correct 2. I was present scrubbed for the entire procedure.

## 2015-03-25 NOTE — Care Management Note (Signed)
Case Management Note  Patient Details  Name: Laurie Palmer MRN: [id number] Date of Birth: 1946/02/28  Subjective/Objective:    [facility]-rep Octavia Bruckner can accept for [facility].  Patient aware of [place] agency.Awaiting [employer] rep for auth for wound vac.                Action/Plan:d/c plan home w/HHC/wound vac.   Expected Discharge Date:                  Expected Discharge Plan:  [place]  In-House Referral:     Discharge planning Services  CM Consult  Post Acute Care Choice:  Home Health Choice offered to:  Patient  DME Arranged:  Vac DME Agency:     HH Arranged:  RN [place] Agency:  [place]  Status of Service:  In process, will continue to follow  Medicare Important Message Given:    Date Medicare IM Given:    Medicare IM give by:    Date Additional Medicare IM Given:    Additional Medicare Important Message give by:     If discussed at [place] of Stay Meetings, dates discussed:    Additional Comments:  Dessa Phi, RN 03/25/2015, 3:37 PM

## 2015-03-25 NOTE — H&P (View-Only) (Signed)
Patient well-known to me, she was seen yesterday in clinic and diagnosed with a spontaneously draining left thigh abscess. She declined admission at that point and needed to return home. She is now been admitted and started on antibiotics. She has been on a diet. I discussed at length with the hospitalist who agrees that this can be handled here and no other consults are necessary at this point. I reviewed this with the patient she understands and agrees. She to would prefer to be treated here. As such we will plan to make her nothing by mouth after midnight. Hold her next dose of Lovenox. We will plan to take her to the operating room tomorrow for incision and drainage of left thigh abscess with biopsies. We will ultimately place a wound VAC. She understands this will require prolonged period of recuperation and wound VAC changes. Risk and benefits were reviewed at length including bleeding infection and need for further surgery. She also understands the possibility this could be malignant in which case she may require additional resection and/or radiation therapy. 

## 2015-03-25 NOTE — Progress Notes (Signed)
Laurie Palmer [id number] DOB: 1946/04/25 DOA: 03/23/2015 PCP: Monico Blitz, MD  Brief narrative:  69 y/o ? 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 Rolanda Jay  Procedures:  I & D abcess  Antibiotics:   vanc 11/9-11/10  Zosyn 11/9   Subjective   Alert pleasant and just back from OR Pain is controlled at 3-4 range No other issues currently   Objective    Interim History:   Telemetry:    Objective: Filed Vitals:   03/25/15 1242 03/25/15 1245 03/25/15 1247 03/25/15 1304  BP:  129/75  123/71  Pulse: 73 73 70 70  Temp:    97.7 F (36.5 C)  TempSrc:      Resp: 25 22 20 18   Height:      Weight:      SpO2: 97% 98% 99% 94%    Intake/Output Summary (Last 24 hours) at 03/25/15 1311 Last data filed at 03/25/15 1251  Gross per 24 hour  Intake   1200 ml  Output      0 ml  Net   1200 ml    Exam:  General: eomi, ncat Cardiovascular: s1 s 2no m/r/g Respiratory: clear no added sound Abdomen:  Soft nt Skin: Wound vac on wound-wound not examined Neuro intact  Data Reviewed: Basic Metabolic Panel:  Recent Labs Lab 03/23/15 2314 03/25/15 0857  NA 139 137  K 3.7 4.2  CL 104 102  CO2 26 30  GLUCOSE 177* 135*  BUN 12 9  CREATININE 0.70 0.66  CALCIUM 10.9* 10.0   Liver Function Tests:  Recent Labs Lab 03/23/15 2314  AST 18  ALT 18  ALKPHOS 135*  BILITOT 0.5  PROT 8.4*  ALBUMIN 3.6   No results for input(s): LIPASE, AMYLASE in the last 168 hours. No results for input(s): AMMONIA in the last 168 hours. CBC:  Recent Labs Lab 03/23/15 2314 03/25/15 0857  WBC 8.4 7.4  NEUTROABS 5.2  --   HGB 12.1 11.8*  HCT 38.1 38.1  MCV 85.2 87.2  PLT 497* 454*   Cardiac Enzymes: No results  for input(s): CKTOTAL, CKMB, CKMBINDEX, TROPONINI in the last 168 hours. BNP: Invalid input(s): POCBNP CBG:  Recent Labs Lab 03/24/15 1202 03/24/15 1720 03/24/15 2112 03/25/15 0753 03/25/15 1233  GLUCAP 130* 132* 168* 126* 120*    Recent Results (from the past 240 hour(s))  Surgical pcr screen     Status: Abnormal   Collection Time: 03/25/15  7:24 AM  Result Value Ref Range Status   MRSA, PCR POSITIVE (A) NEGATIVE Final    Comment: RESULT CALLED TO, READ BACK BY AND VERIFIED WITH: C. HEARN RN AT [id number] ON 11.10.16 BY SHUEA    Staphylococcus aureus POSITIVE (A) NEGATIVE Final    Comment:        The Xpert SA Assay (FDA approved for NASAL specimens in patients over 44 years of age), is one component of a comprehensive surveillance program.  Test performance has been validated by [facility] for patients greater than or equal to 32 year old. It is not intended to diagnose infection nor to guide or monitor treatment.      Studies:              All Imaging  reviewed and is as per above notation   Scheduled Meds: . Chlorhexidine Gluconate Cloth  6 each Topical Q0600  . HYDROmorphone      . HYDROmorphone      . insulin aspart  0-9 Units Subcutaneous TID WC  . insulin detemir  10 Units Subcutaneous QHS  . mupirocin ointment  1 application Nasal BID  . piperacillin-tazobactam (ZOSYN)  IV  3.375 g Intravenous Q8H  . vancomycin  1,000 mg Intravenous Q12H   Continuous Infusions:    Assessment/Plan:   1. LLE abscess-s/p I & D day #0-anxious to go home.  [place] vanc/zosyn to zosyn.  F/u prelim cult.  Cbc + diff am.  Eval pt ot and ambulate. transition to PO augmentin and would keep on this for 5-7 days total Abx-cult can be followed as OP 2. DM ty II-CB Gs' in the 1 teens.  Continue SSI for now 3. Morbid obesity, Body mass index is 39.82 kg/(m^2).-needs OP management and consideration for modalities of Rx   15 min Med surg Likely home 24 hrs  Verneita Griffes, MD  [facility] Pager [phone] 03/25/2015, 1:11 PM    LOS: 1 day

## 2015-03-25 NOTE — Care Management Note (Signed)
Case Management Note  Patient Details  Name: [name] MRN: [id number] Date of Birth: 08-17-1945  Subjective/Objective:  S/p I&D L thigh today w/Wound vac placed.  Attempting to find [facility] agency for [place], [place] area-faxed to [facility],[employer](they are checking if they can accept),[employer] rep Octavia Bruckner is also checking if they can accept. Unable to provide:[facility] home care, [facility]. Faxed signed wound vac form to [employer] rep Rickie w/confirmation, along w/H&P,face sheet,op note,progress note-following for auth for Wound Vac.  Await responses from these services.                  Action/Plan:d/c plan home w/HHC/Wound vac.   Expected Discharge Date:                  Expected Discharge Plan:  [place]  In-House Referral:     Discharge planning Services  CM Consult  Post Acute Care Choice:  Home Health Choice offered to:  Patient  DME Arranged:  Vac DME Agency:     HH Arranged:    HH Agency:     Status of Service:  In process, will continue to follow  Medicare Important Message Given:    Date Medicare IM Given:    Medicare IM give by:    Date Additional Medicare IM Given:    Additional Medicare Important Message give by:     If discussed at [place] of Stay Meetings, dates discussed:    Additional Comments:  Dessa Phi, RN 03/25/2015, 3:14 PM

## 2015-03-26 LAB — CBC WITH DIFFERENTIAL/PLATELET
BASOS PCT: 0 %
Basophils Absolute: 0 10*3/uL (ref 0.0–0.1)
Eosinophils Absolute: 0.4 10*3/uL (ref 0.0–0.7)
Eosinophils Relative: 5 %
HEMATOCRIT: 36.4 % (ref 36.0–46.0)
HEMOGLOBIN: 11.2 g/dL — AB (ref 12.0–15.0)
LYMPHS ABS: 1.6 10*3/uL (ref 0.7–4.0)
LYMPHS PCT: 22 %
MCH: 27.1 pg (ref 26.0–34.0)
MCHC: 30.8 g/dL (ref 30.0–36.0)
MCV: 88.1 fL (ref 78.0–100.0)
MONOS PCT: 9 %
Monocytes Absolute: 0.7 10*3/uL (ref 0.1–1.0)
NEUTROS ABS: 4.8 10*3/uL (ref 1.7–7.7)
NEUTROS PCT: 64 %
Platelets: 424 10*3/uL — ABNORMAL HIGH (ref 150–400)
RBC: 4.13 MIL/uL (ref 3.87–5.11)
RDW: 13.9 % (ref 11.5–15.5)
WBC: 7.4 10*3/uL (ref 4.0–10.5)

## 2015-03-26 LAB — BASIC METABOLIC PANEL
Anion gap: 5 (ref 5–15)
BUN: 9 mg/dL (ref 6–20)
CHLORIDE: 104 mmol/L (ref 101–111)
CO2: 29 mmol/L (ref 22–32)
CREATININE: 0.69 mg/dL (ref 0.44–1.00)
Calcium: 10.1 mg/dL (ref 8.9–10.3)
GFR calc non Af Amer: >60 mL/min (ref >60)
GLUCOSE: 135 mg/dL — AB (ref 65–99)
Potassium: 4.3 mmol/L (ref 3.5–5.1)
Sodium: 138 mmol/L (ref 135–145)

## 2015-03-26 LAB — GLUCOSE, CAPILLARY
GLUCOSE-CAPILLARY: 117 mg/dL — AB (ref 65–99)
GLUCOSE-CAPILLARY: 128 mg/dL — AB (ref 65–99)
Glucose-Capillary: 126 mg/dL — ABNORMAL HIGH (ref 65–99)

## 2015-03-26 LAB — WOUND CULTURE

## 2015-03-26 MED ORDER — ENOXAPARIN SODIUM 60 MG/0.6ML ~~LOC~~ SOLN: 60.0000 mg | SUBCUTANEOUS | Status: DC

## 2015-03-26 MED ORDER — AMOXICILLIN-POT CLAVULANATE 875-125 MG PO TABS: 1.0000 | ORAL_TABLET | Freq: Two times a day (BID) | ORAL | Status: DC

## 2015-03-26 MED ORDER — OXYCODONE-ACETAMINOPHEN 5-325 MG PO TABS: 2.0000 | ORAL_TABLET | ORAL | Status: DC | PRN

## 2015-03-26 NOTE — Discharge Summary (Signed)
Physician Discharge Summary  Laurie Palmer [id number] DOB: 1945/06/06 DOA: 03/23/2015  PCP: Monico Blitz, MD  Admit date: 03/23/2015 Discharge date: 03/26/2015  Time spent: 35 minutes  Recommendations for Outpatient Follow-up:   1. Complete 6 more days of Augemtin as therapy for abscess-Would ask that Dr. Manuella Ghazi follow up on result and adjust/extend abx if neede 2. wound ac to be managed by [facility] RN at home  3. It has been suggested to week Lovenox prophylactic dosing weight-based which has been ordered for the patient on discharge given relative immobility 4. Consider Cbc + Chem-7 in 1 week 5. limited pain control with Percocet ordered for the patient  Discharge Diagnoses:  Principal Problem:   Abscess of left leg Active Problems:   Abscess   Discharge Condition: fair   Diet recommendation: Heart healthy low-salt diabetic  Filed Weights   03/23/15 2204 03/24/15 0227  Weight: 118.389 kg (261 lb) 120.566 kg (265 lb 12.8 oz)    History of present illness:  69 y/o ? 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 LLE abscess-s/p I & D day #1anxious to go home. Narrow vanc/zosyn to zosyn-->Augmentin 12 tablets prescribed complete and 6 days. F/u prelim cult not available as yet. Cbc + diff did not show any leukocytosis or severe blood loss hemoglobin 11.2 white count 7.4. Patient stable for discharge from general surgeon standpoint will need Lovenox for primary prevention of DVT given relative immobility. Home health has been arranged with home health fact and patient has close follow-up in the outpatient setting with surgeon 2. DM ty II-CB Gs' in the 1 teens. Continue SSI for now-she will resume her home dosing of insulin-Levemir 10 U qpm and SSI as per home regimen, Invokana 100 daily 3. Morbid obesity,  Body mass index is 39.82 kg/(m^2).-needs OP management and consideration for modalities of Rx  Procedures:  I&D of complicated abscess posterior left thigh 03/25/15  Consultations:  Dr. Rolanda Jay of surgery  Discharge Exam: Filed Vitals:   03/26/15 1000  BP: 114/78  Pulse: 89  Temp: 97.6 F (36.4 C)  Resp: 18    General: eomi ncat Cardiovascular: s1 s 2no m/r/g Respiratory: cta b [facility] in Vac.  Mild erythema and induration surrounding  Discharge Instructions   Discharge Instructions    Diet - low sodium heart healthy    Complete by:  As directed      Discharge instructions    Complete by:  As directed   Please follow up with Dr. Rolanda Jay for further care. Would suggets that you go and see Dr. Rolanda Jay of surgery in about 4-5 days We will get someone to come and help you with the Wound-Vac at home in terms of home health Complete another 6 days of antibiotics with Augemntin.  We will call in a supply of this for you to your pharmacy and I will also Rx for you some percocet for pain It has been suggested by your surgeon to take a prophylactic dose of a blood thinner called Lovenox to prevent DVT's as you might be a little less mobile than usual and this may place you at risk for DVT.     Increase activity slowly    Complete by:  As directed           Current Discharge Medication List  START taking these medications   Details  amoxicillin-clavulanate (AUGMENTIN) 875-125 MG tablet Take 1 tablet by mouth 2 (two) times daily. Qty: 12 tablet, Refills: 0    enoxaparin (LOVENOX) 60 MG/0.6ML injection Inject 0.6 mLs (60 mg total) into the skin daily. Qty: 14 Syringe, Refills: 0    oxyCODONE-acetaminophen (PERCOCET/ROXICET) 5-325 MG tablet Take 2 tablets by mouth every 4 (four) hours as needed for severe pain. Qty: 30 tablet, Refills: 0      CONTINUE these medications which have NOT CHANGED   Details  aspirin (ASPIRIN EC) 81 MG EC tablet Take 81 mg by mouth daily.      canagliflozin (INVOKANA) 100 MG TABS tablet Take 100 mg by mouth daily before breakfast.     Cholecalciferol (VITAMIN D3) 1000 UNITS CHEW Chew 1 each by mouth daily.     HYDROcodone-acetaminophen (NORCO/VICODIN) 5-325 MG tablet Take 1 tablet by mouth every 4 (four) hours as needed for moderate pain.     insulin aspart (NOVOLOG) 100 UNIT/ML injection Inject into the skin. 10-14 units according to sliding scale once daily    insulin detemir (LEVEMIR) 100 UNIT/ML injection Inject 10 Units into the skin every evening.     metFORMIN (GLUCOPHAGE) 500 MG tablet Take 500 mg by mouth 2 (two) times daily with a meal.       STOP taking these medications     ibuprofen (ADVIL,MOTRIN) 800 MG tablet        Allergies  Allergen Reactions  . Shellfish Allergy Swelling and Anaphylaxis   Follow-up Information    Follow up with [facility].   Why:  [facility]   Contact information:   [address] [place] [address] [phone]       Follow up with Frederich Cha, MD On 04/06/2015.   Specialty:  General Surgery   Why:  at 2:30 at the [facility] information:   [place] [id number] [phone]        The results of significant diagnostics from this hospitalization (including imaging, microbiology, ancillary and laboratory) are listed below for reference.    Significant Diagnostic Studies: Mr Outside Films Lower Extremity  03/23/2015  CLINICAL DATA:  This exam is stored here for comparison purposes only and was performed at an outside facility.   Please contact the originating institution for any associated interpretation or report.    Microbiology: Recent Results (from the past 240 hour(s))  Wound culture     Status: None   Collection Time: 03/23/15  4:32 PM  Result Value Ref Range Status   Culture, Wound Culture, Wound  Final    Comment: Final - ===== GRAM STAIN: ===== Few WBC present-both PMN and Mononuclear No Squamous Epithelial  Cells Seen Few Gram Positive Cocci In Pairs In Clusters Abundant METHICILLIN RESISTANT STAPHYLOCOCCUS AUREUS  ------------------------------------------------------------------------ Rifampin and Gentamicin should not be used as single drugs for treatment of Staph infections. This organism DOES NOT demonstrate inducible Clindamycin resistance in vitro.  ------------------------------------------------------------------------  METHICILLIN RESISTANT STAPHYLOCOCCUS AUREUS     OXACILLIN                        MIC      Resistant        >=4 ug/ml    CEFAZOLIN                        MIC      Resistant  ug/ml    GENTAMICIN                       MIC      Sensitive      <=0.5 ug/ml    CIPROFLOXACIN                    MIC      Resistant        >=8 ug/ml    LEVOFLOXACIN                     MIC      Indeterminate      4 ug/ml    TRIMETH/SULFA                    MIC      Sen sitive       <=10 ug/ml    VANCOMYCIN                       MIC      Sensitive      <=0.5 ug/ml    CLINDAMYCIN                      MIC      Sensitive     <=0.25 ug/ml    ERYTHROMYCIN                     MIC      Resistant        >=8 ug/ml    LINEZOLID                        MIC      Sensitive          2 ug/ml    RIFAMPIN                         MIC      Sensitive      <=0.5 ug/ml    TETRACYCLINE                     MIC      Sensitive        <=1 ug/ml END OF REPORT   Culture, blood (routine x 2)     Status: None (Preliminary result)   Collection Time: 03/23/15 11:13 PM  Result Value Ref Range Status   Specimen Description BLOOD RIGHT ANTECUBITAL  Final   Special Requests BOTTLES DRAWN AEROBIC AND ANAEROBIC 5 ML  Final   Culture   Final    NO GROWTH 1 DAY Performed at [facility]    Report Status PENDING  Incomplete  Culture, blood (routine x 2)     Status: None (Preliminary result)   Collection Time: 03/23/15 11:13 PM  Result Value Ref Range Status   Specimen Description BLOOD LEFT WRIST  Final    Special Requests BOTTLES DRAWN AEROBIC AND ANAEROBIC 5 ML  Final   Culture   Final    NO GROWTH 1 DAY Performed at [facility]    Report Status PENDING  Incomplete  Surgical pcr screen     Status: Abnormal   Collection Time: 03/25/15  7:24 AM  Result Value Ref Range Status   MRSA, PCR POSITIVE (A) NEGATIVE Final    Comment: RESULT CALLED TO, READ BACK BY AND  VERIFIED WITH: [place] RN AT [phone] ON 11.10.16 BY SHUEA    Staphylococcus aureus POSITIVE (A) NEGATIVE Final    Comment:        The Xpert SA Assay (FDA approved for NASAL specimens in patients over 1 years of age), is one component of a comprehensive surveillance program.  Test performance has been validated by [facility] for patients greater than or equal to 65 year old. It is not intended to diagnose infection nor to guide or monitor treatment.   Tissue culture     Status: None (Preliminary result)   Collection Time: 03/25/15 11:50 AM  Result Value Ref Range Status   Specimen Description TISSUE POSTERIOR THIGH SOFT TISSUE  Final   Special Requests   Final    PATIENT ON FOLLOWING ZOYSN 3.375 GM CLIDAMYCIN 600MG    Gram Stain   Final    RARE WBC PRESENT,BOTH PMN AND MONONUCLEAR NO ORGANISMS SEEN Performed at [facility]    Culture PENDING  Incomplete   Report Status PENDING  Incomplete     Labs: Basic Metabolic Panel:  Recent Labs Lab 03/23/15 2314 03/25/15 0857 03/26/15 0540  NA 139 137 138  K 3.7 4.2 4.3  CL 104 102 104  CO2 26 30 29   GLUCOSE 177* 135* 135*  BUN 12 9 9   CREATININE 0.70 0.66 0.69  CALCIUM 10.9* 10.0 10.1   Liver Function Tests:  Recent Labs Lab 03/23/15 2314  AST 18  ALT 18  ALKPHOS 135*  BILITOT 0.5  PROT 8.4*  ALBUMIN 3.6   No results for input(s): LIPASE, AMYLASE in the last 168 hours. No results for input(s): AMMONIA in the last 168 hours. CBC:  Recent Labs Lab 03/23/15 2314 03/25/15 0857 03/26/15 0540  WBC 8.4 7.4 7.4  NEUTROABS 5.2  --  4.8   HGB 12.1 11.8* 11.2*  HCT 38.1 38.1 36.4  MCV 85.2 87.2 88.1  PLT 497* 454* 424*   Cardiac Enzymes: No results for input(s): CKTOTAL, CKMB, CKMBINDEX, TROPONINI in the last 168 hours. BNP: BNP (last 3 results) No results for input(s): BNP in the last 8760 hours.  ProBNP (last 3 results) No results for input(s): PROBNP in the last 8760 hours.  CBG:  Recent Labs Lab 03/25/15 1233 03/25/15 1358 03/25/15 1652 03/25/15 2128 03/26/15 0740  GLUCAP 120* 150* 129* 158* 126*       Signed:  Nita Sells  [facility] 03/26/2015, 10:49 AM

## 2015-03-26 NOTE — Evaluation (Signed)
Physical Therapy Evaluation Patient Details Name: Laurie Palmer MRN: [id number] DOB: 03/16/1946 Today's Date: 03/26/2015   History of Present Illness  69 yo female adm  with LLE abscess, s/p I and D with VAC placement  Clinical Impression  Patient evaluated by Physical Therapy with no further acute PT needs identified. All education has been completed and the patient has no further questions.  See below for any follow-up Physical Therapy or equipment needs. PT is signing off. Thank you for this referral.     Follow Up Recommendations No PT follow up    Equipment Recommendations  None recommended by PT    Recommendations for Other Services       Precautions / Restrictions Precautions Precaution Comments: VAC      Mobility  Bed Mobility               General bed mobility comments: NT--pt on EOB  Transfers Overall transfer level: Modified independent Equipment used: None Transfers: Sit to/from Stand Sit to Stand: Independent            Ambulation/Gait Ambulation/Gait assistance: Supervision Ambulation Distance (Feet): 200 Feet Assistive device: None (or IV pole) Gait Pattern/deviations: Step-through pattern     General Gait Details: pt feels she is moving near her baseline, has cane if needed at home; she is able to amb without IV pole, further discussed use of cane prn, pt agrees; no pain with amb/WBing   Stairs            Wheelchair Mobility    Modified Rankin (Stroke Patients Only)       Balance Overall balance assessment: No apparent balance deficits (not formally assessed)                                           Pertinent Vitals/Pain Pain Assessment: No/denies pain    Home Living Family/patient expects to be discharged to:: Private residence Living Arrangements: Alone   Type of Home: House Home Access: Stairs to enter       Home Equipment: Environmental consultant - 2 wheels;Cane - single point Additional Comments: pt  reports she has lots of siblings and family that check in on her/can assist if needed    Prior Function Level of Independence: Independent               Hand Dominance        Extremity/Trunk Assessment   Upper Extremity Assessment: Overall WFL for tasks assessed           Lower Extremity Assessment: Overall WFL for tasks assessed         Communication   Communication: No difficulties  Cognition Arousal/Alertness: Awake/alert Behavior During Therapy: WFL for tasks assessed/performed Overall Cognitive Status: Within Functional Limits for tasks assessed                      General Comments      Exercises        Assessment/Plan    PT Assessment    PT Diagnosis Difficulty walking   PT Problem List    PT Treatment Interventions     PT Goals (Current goals can be found in the Care Plan section) Acute Rehab PT Goals Patient Stated Goal: to get out of here PT Goal Formulation: All assessment and education complete, DC therapy    Frequency     Barriers  to discharge        Co-evaluation               End of Session   Activity Tolerance: Patient tolerated treatment well Patient left: in bed;with call bell/phone within reach;with family/visitor present           Time: 1000-1015 PT Time Calculation (min) (ACUTE ONLY): 15 min   Charges:   PT Evaluation $Initial PT Evaluation Tier I: 1 Procedure     PT G CodesKenyon Ana 04-25-15, 10:19 AM

## 2015-03-26 NOTE — Progress Notes (Signed)
POD #1 S/P I & D Left thigh abscess. No significant issues overnight Afeb VSS Wound vac in place with good seal.  Serosanguinous output. A/P: MRSA abscess of left thigh S/P I & D with wound vac placement. Currently afebrile with normal WBC. OK for d/c home from surgery standpoint. Recommend lovenox for two weeks. Will sign off.  Please call for any questions.

## 2015-03-26 NOTE — Care Management Note (Signed)
Case Management Note  Patient Details  Name: Laurie Palmer MRN: [id number] Date of Birth: Apr 04, 1946  Subjective/Objective: Received call from [facility]([phone]) for release & delivery of Home wound vac to hospital to patient's rm(confirmed) time frame 2-4 hrs for delivery(before 5:30p), they received verification, & auth   From [facility] tel#[phone].  Patient/Nsg aware of current status of delivery to hospital.[facility] already set up.               Action/Plan:d/c home w/HHC/wound vac.   Expected Discharge Date:                  Expected Discharge Plan:  [place]  In-House Referral:     Discharge planning Services  CM Consult  Post Acute Care Choice:  Home Health Choice offered to:  Patient  DME Arranged:  Vac DME Agency:  [facility]  HH Arranged:  RN [place] Agency:  [place]  Status of Service:  Completed, signed off  Medicare Important Message Given:    Date Medicare IM Given:    Medicare IM give by:    Date Additional Medicare IM Given:    Additional Medicare Important Message give by:     If discussed at [place] of Stay Meetings, dates discussed:    Additional Comments:  Dessa Phi, RN 03/26/2015, 1:38 PM

## 2015-03-27 DIAGNOSIS — E119 Type 2 diabetes mellitus without complications: Secondary | ICD-10-CM | POA: Diagnosis not present

## 2015-03-27 DIAGNOSIS — Z794 Long term (current) use of insulin: Secondary | ICD-10-CM | POA: Diagnosis not present

## 2015-03-27 DIAGNOSIS — Z7984 Long term (current) use of oral hypoglycemic drugs: Secondary | ICD-10-CM | POA: Diagnosis not present

## 2015-03-27 DIAGNOSIS — A4902 Methicillin resistant Staphylococcus aureus infection, unspecified site: Secondary | ICD-10-CM | POA: Diagnosis not present

## 2015-03-27 DIAGNOSIS — L02416 Cutaneous abscess of left lower limb: Secondary | ICD-10-CM | POA: Diagnosis not present

## 2015-03-28 DIAGNOSIS — Z794 Long term (current) use of insulin: Secondary | ICD-10-CM | POA: Diagnosis not present

## 2015-03-28 DIAGNOSIS — E119 Type 2 diabetes mellitus without complications: Secondary | ICD-10-CM | POA: Diagnosis not present

## 2015-03-28 DIAGNOSIS — A4902 Methicillin resistant Staphylococcus aureus infection, unspecified site: Secondary | ICD-10-CM | POA: Diagnosis not present

## 2015-03-28 DIAGNOSIS — Z7984 Long term (current) use of oral hypoglycemic drugs: Secondary | ICD-10-CM | POA: Diagnosis not present

## 2015-03-28 DIAGNOSIS — L02416 Cutaneous abscess of left lower limb: Secondary | ICD-10-CM | POA: Diagnosis not present

## 2015-03-28 LAB — TISSUE CULTURE

## 2015-03-29 ENCOUNTER — Telehealth: Payer: Self-pay | Admitting: Gynecologic Oncology

## 2015-03-29 LAB — CULTURE, BLOOD (ROUTINE X 2)
CULTURE: NO GROWTH
Culture: NO GROWTH

## 2015-03-29 NOTE — Telephone Encounter (Signed)
Called to check on patient's current status.  Patient stating she is doing better.  Stating home health is coming and managing wound vac.  Minimal pain reported and taking PRN pain medication when needed.  Patient's only concern is that she has not been taking her prescribed lovenox injections.  She states the script was sent to [facility] at discharge by the hospitalist and [facility] told her sister the medication would not be in until tomorrow, Nov 15.  Dr. Rolanda Jay informed of the situation.  Patient lives in [place].  Stating she will be able to get the prescription tomorrow.  No other concerns voiced.  No other symptoms reported.

## 2015-03-30 ENCOUNTER — Telehealth: Payer: Self-pay

## 2015-03-30 NOTE — Telephone Encounter (Signed)
Orders received from [facility] to contact the patient to see if the patient had the Lovenox  . Patient states she has the Lovenox , denies any needs at this time l, will call with any changes , questions or concerns.

## 2015-03-31 DIAGNOSIS — Z7984 Long term (current) use of oral hypoglycemic drugs: Secondary | ICD-10-CM | POA: Diagnosis not present

## 2015-03-31 DIAGNOSIS — L02416 Cutaneous abscess of left lower limb: Secondary | ICD-10-CM | POA: Diagnosis not present

## 2015-03-31 DIAGNOSIS — A4902 Methicillin resistant Staphylococcus aureus infection, unspecified site: Secondary | ICD-10-CM | POA: Diagnosis not present

## 2015-03-31 DIAGNOSIS — E119 Type 2 diabetes mellitus without complications: Secondary | ICD-10-CM | POA: Diagnosis not present

## 2015-03-31 DIAGNOSIS — Z794 Long term (current) use of insulin: Secondary | ICD-10-CM | POA: Diagnosis not present

## 2015-04-02 DIAGNOSIS — Z794 Long term (current) use of insulin: Secondary | ICD-10-CM | POA: Diagnosis not present

## 2015-04-02 DIAGNOSIS — L02416 Cutaneous abscess of left lower limb: Secondary | ICD-10-CM | POA: Diagnosis not present

## 2015-04-02 DIAGNOSIS — E119 Type 2 diabetes mellitus without complications: Secondary | ICD-10-CM | POA: Diagnosis not present

## 2015-04-02 DIAGNOSIS — Z7984 Long term (current) use of oral hypoglycemic drugs: Secondary | ICD-10-CM | POA: Diagnosis not present

## 2015-04-02 DIAGNOSIS — A4902 Methicillin resistant Staphylococcus aureus infection, unspecified site: Secondary | ICD-10-CM | POA: Diagnosis not present

## 2015-04-05 DIAGNOSIS — L02416 Cutaneous abscess of left lower limb: Secondary | ICD-10-CM | POA: Diagnosis not present

## 2015-04-05 DIAGNOSIS — Z794 Long term (current) use of insulin: Secondary | ICD-10-CM | POA: Diagnosis not present

## 2015-04-05 DIAGNOSIS — A4902 Methicillin resistant Staphylococcus aureus infection, unspecified site: Secondary | ICD-10-CM | POA: Diagnosis not present

## 2015-04-05 DIAGNOSIS — Z7984 Long term (current) use of oral hypoglycemic drugs: Secondary | ICD-10-CM | POA: Diagnosis not present

## 2015-04-05 DIAGNOSIS — E119 Type 2 diabetes mellitus without complications: Secondary | ICD-10-CM | POA: Diagnosis not present

## 2015-04-06 ENCOUNTER — Encounter: Payer: Self-pay | Admitting: General Surgery

## 2015-04-06 ENCOUNTER — Ambulatory Visit (HOSPITAL_BASED_OUTPATIENT_CLINIC_OR_DEPARTMENT_OTHER): Payer: Medicare Other | Admitting: General Surgery

## 2015-04-06 VITALS — BP 123/73 | HR 79 | Temp 98.2°F | Wt 257.1 lb

## 2015-04-06 DIAGNOSIS — L02416 Cutaneous abscess of left lower limb: Secondary | ICD-10-CM

## 2015-04-06 MED ORDER — OXYCODONE-ACETAMINOPHEN 5-325 MG PO TABS: 1.0000 | ORAL_TABLET | ORAL | Status: DC | PRN

## 2015-04-06 NOTE — Patient Instructions (Addendum)
Plan to follow up with Dr. Rolanda Jay in one month or sooner if needed.  The wound should become smaller and smaller.  Call for the development of any new symptoms including fever, chills.

## 2015-04-07 DIAGNOSIS — Z794 Long term (current) use of insulin: Secondary | ICD-10-CM | POA: Diagnosis not present

## 2015-04-07 DIAGNOSIS — Z7984 Long term (current) use of oral hypoglycemic drugs: Secondary | ICD-10-CM | POA: Diagnosis not present

## 2015-04-07 DIAGNOSIS — A4902 Methicillin resistant Staphylococcus aureus infection, unspecified site: Secondary | ICD-10-CM | POA: Diagnosis not present

## 2015-04-07 DIAGNOSIS — E119 Type 2 diabetes mellitus without complications: Secondary | ICD-10-CM | POA: Diagnosis not present

## 2015-04-07 DIAGNOSIS — L02416 Cutaneous abscess of left lower limb: Secondary | ICD-10-CM | POA: Diagnosis not present

## 2015-04-07 NOTE — Progress Notes (Signed)
I had the opportunity to meet with Laurie Palmer status post incision and drainage of left posterior thigh abscess with wound VAC placement. She's done very well since surgery. She has no specific complaints. She denies fever, chills, warmth of the extremity or cellulitis. Her wound VAC is been working without abnormality. Additionally, she reports that the drainage has been steadily decreasing.  On examination with a female chaperone in the room, the wound VAC is intact with an excellent seal. As it was changed just yesterday it was left intact.  Her cultures showed MRSA. She is currently asymptomatic and the abscess is now drained. She is completed her course of antibiotics. As such we will continue wound care and follow expectantly.  She understands that there was no communication found between the abscess cavity in the primary lesion on the anterior part of her leg. It was agreed that we will resolve the active infection and then once this is completely healed revisit doing a biopsy possible excision of the large thigh mass.  I will see her back in 1 month or wound check. She understands to contact us immediately if she has any fever, chills or any worsening of her condition.

## 2015-04-09 DIAGNOSIS — L02416 Cutaneous abscess of left lower limb: Secondary | ICD-10-CM | POA: Diagnosis not present

## 2015-04-09 DIAGNOSIS — E119 Type 2 diabetes mellitus without complications: Secondary | ICD-10-CM | POA: Diagnosis not present

## 2015-04-09 DIAGNOSIS — Z7984 Long term (current) use of oral hypoglycemic drugs: Secondary | ICD-10-CM | POA: Diagnosis not present

## 2015-04-09 DIAGNOSIS — A4902 Methicillin resistant Staphylococcus aureus infection, unspecified site: Secondary | ICD-10-CM | POA: Diagnosis not present

## 2015-04-09 DIAGNOSIS — Z794 Long term (current) use of insulin: Secondary | ICD-10-CM | POA: Diagnosis not present

## 2015-04-12 DIAGNOSIS — L02416 Cutaneous abscess of left lower limb: Secondary | ICD-10-CM | POA: Diagnosis not present

## 2015-04-12 DIAGNOSIS — Z7984 Long term (current) use of oral hypoglycemic drugs: Secondary | ICD-10-CM | POA: Diagnosis not present

## 2015-04-12 DIAGNOSIS — E119 Type 2 diabetes mellitus without complications: Secondary | ICD-10-CM | POA: Diagnosis not present

## 2015-04-12 DIAGNOSIS — A4902 Methicillin resistant Staphylococcus aureus infection, unspecified site: Secondary | ICD-10-CM | POA: Diagnosis not present

## 2015-04-12 DIAGNOSIS — Z794 Long term (current) use of insulin: Secondary | ICD-10-CM | POA: Diagnosis not present

## 2015-04-14 DIAGNOSIS — E119 Type 2 diabetes mellitus without complications: Secondary | ICD-10-CM | POA: Diagnosis not present

## 2015-04-14 DIAGNOSIS — Z7984 Long term (current) use of oral hypoglycemic drugs: Secondary | ICD-10-CM | POA: Diagnosis not present

## 2015-04-14 DIAGNOSIS — L02416 Cutaneous abscess of left lower limb: Secondary | ICD-10-CM | POA: Diagnosis not present

## 2015-04-14 DIAGNOSIS — Z794 Long term (current) use of insulin: Secondary | ICD-10-CM | POA: Diagnosis not present

## 2015-04-14 DIAGNOSIS — A4902 Methicillin resistant Staphylococcus aureus infection, unspecified site: Secondary | ICD-10-CM | POA: Diagnosis not present

## 2015-04-16 DIAGNOSIS — Z794 Long term (current) use of insulin: Secondary | ICD-10-CM | POA: Diagnosis not present

## 2015-04-16 DIAGNOSIS — A4902 Methicillin resistant Staphylococcus aureus infection, unspecified site: Secondary | ICD-10-CM | POA: Diagnosis not present

## 2015-04-16 DIAGNOSIS — E119 Type 2 diabetes mellitus without complications: Secondary | ICD-10-CM | POA: Diagnosis not present

## 2015-04-16 DIAGNOSIS — L02416 Cutaneous abscess of left lower limb: Secondary | ICD-10-CM | POA: Diagnosis not present

## 2015-04-16 DIAGNOSIS — Z7984 Long term (current) use of oral hypoglycemic drugs: Secondary | ICD-10-CM | POA: Diagnosis not present

## 2015-04-19 DIAGNOSIS — E119 Type 2 diabetes mellitus without complications: Secondary | ICD-10-CM | POA: Diagnosis not present

## 2015-04-19 DIAGNOSIS — Z7984 Long term (current) use of oral hypoglycemic drugs: Secondary | ICD-10-CM | POA: Diagnosis not present

## 2015-04-19 DIAGNOSIS — A4902 Methicillin resistant Staphylococcus aureus infection, unspecified site: Secondary | ICD-10-CM | POA: Diagnosis not present

## 2015-04-19 DIAGNOSIS — Z794 Long term (current) use of insulin: Secondary | ICD-10-CM | POA: Diagnosis not present

## 2015-04-19 DIAGNOSIS — L02416 Cutaneous abscess of left lower limb: Secondary | ICD-10-CM | POA: Diagnosis not present

## 2015-04-21 DIAGNOSIS — Z7984 Long term (current) use of oral hypoglycemic drugs: Secondary | ICD-10-CM | POA: Diagnosis not present

## 2015-04-21 DIAGNOSIS — L02416 Cutaneous abscess of left lower limb: Secondary | ICD-10-CM | POA: Diagnosis not present

## 2015-04-21 DIAGNOSIS — Z794 Long term (current) use of insulin: Secondary | ICD-10-CM | POA: Diagnosis not present

## 2015-04-21 DIAGNOSIS — A4902 Methicillin resistant Staphylococcus aureus infection, unspecified site: Secondary | ICD-10-CM | POA: Diagnosis not present

## 2015-04-21 DIAGNOSIS — E119 Type 2 diabetes mellitus without complications: Secondary | ICD-10-CM | POA: Diagnosis not present

## 2015-04-23 DIAGNOSIS — E119 Type 2 diabetes mellitus without complications: Secondary | ICD-10-CM | POA: Diagnosis not present

## 2015-04-23 DIAGNOSIS — A4902 Methicillin resistant Staphylococcus aureus infection, unspecified site: Secondary | ICD-10-CM | POA: Diagnosis not present

## 2015-04-23 DIAGNOSIS — L02416 Cutaneous abscess of left lower limb: Secondary | ICD-10-CM | POA: Diagnosis not present

## 2015-04-23 DIAGNOSIS — Z794 Long term (current) use of insulin: Secondary | ICD-10-CM | POA: Diagnosis not present

## 2015-04-23 DIAGNOSIS — Z7984 Long term (current) use of oral hypoglycemic drugs: Secondary | ICD-10-CM | POA: Diagnosis not present

## 2015-04-26 DIAGNOSIS — Z794 Long term (current) use of insulin: Secondary | ICD-10-CM | POA: Diagnosis not present

## 2015-04-26 DIAGNOSIS — A4902 Methicillin resistant Staphylococcus aureus infection, unspecified site: Secondary | ICD-10-CM | POA: Diagnosis not present

## 2015-04-26 DIAGNOSIS — Z7984 Long term (current) use of oral hypoglycemic drugs: Secondary | ICD-10-CM | POA: Diagnosis not present

## 2015-04-26 DIAGNOSIS — E119 Type 2 diabetes mellitus without complications: Secondary | ICD-10-CM | POA: Diagnosis not present

## 2015-04-26 DIAGNOSIS — L02416 Cutaneous abscess of left lower limb: Secondary | ICD-10-CM | POA: Diagnosis not present

## 2015-04-28 DIAGNOSIS — L02416 Cutaneous abscess of left lower limb: Secondary | ICD-10-CM | POA: Diagnosis not present

## 2015-04-28 DIAGNOSIS — Z7984 Long term (current) use of oral hypoglycemic drugs: Secondary | ICD-10-CM | POA: Diagnosis not present

## 2015-04-28 DIAGNOSIS — Z794 Long term (current) use of insulin: Secondary | ICD-10-CM | POA: Diagnosis not present

## 2015-04-28 DIAGNOSIS — A4902 Methicillin resistant Staphylococcus aureus infection, unspecified site: Secondary | ICD-10-CM | POA: Diagnosis not present

## 2015-04-28 DIAGNOSIS — E119 Type 2 diabetes mellitus without complications: Secondary | ICD-10-CM | POA: Diagnosis not present

## 2015-04-30 DIAGNOSIS — Z7984 Long term (current) use of oral hypoglycemic drugs: Secondary | ICD-10-CM | POA: Diagnosis not present

## 2015-04-30 DIAGNOSIS — L02416 Cutaneous abscess of left lower limb: Secondary | ICD-10-CM | POA: Diagnosis not present

## 2015-04-30 DIAGNOSIS — A4902 Methicillin resistant Staphylococcus aureus infection, unspecified site: Secondary | ICD-10-CM | POA: Diagnosis not present

## 2015-04-30 DIAGNOSIS — Z794 Long term (current) use of insulin: Secondary | ICD-10-CM | POA: Diagnosis not present

## 2015-04-30 DIAGNOSIS — E119 Type 2 diabetes mellitus without complications: Secondary | ICD-10-CM | POA: Diagnosis not present

## 2015-05-03 DIAGNOSIS — A4902 Methicillin resistant Staphylococcus aureus infection, unspecified site: Secondary | ICD-10-CM | POA: Diagnosis not present

## 2015-05-03 DIAGNOSIS — E119 Type 2 diabetes mellitus without complications: Secondary | ICD-10-CM | POA: Diagnosis not present

## 2015-05-03 DIAGNOSIS — Z794 Long term (current) use of insulin: Secondary | ICD-10-CM | POA: Diagnosis not present

## 2015-05-03 DIAGNOSIS — Z7984 Long term (current) use of oral hypoglycemic drugs: Secondary | ICD-10-CM | POA: Diagnosis not present

## 2015-05-03 DIAGNOSIS — L02416 Cutaneous abscess of left lower limb: Secondary | ICD-10-CM | POA: Diagnosis not present

## 2015-05-04 ENCOUNTER — Encounter: Payer: Self-pay | Admitting: General Surgery

## 2015-05-04 ENCOUNTER — Ambulatory Visit (HOSPITAL_BASED_OUTPATIENT_CLINIC_OR_DEPARTMENT_OTHER): Payer: Medicare Other | Admitting: General Surgery

## 2015-05-04 DIAGNOSIS — L02416 Cutaneous abscess of left lower limb: Secondary | ICD-10-CM

## 2015-05-04 NOTE — Patient Instructions (Signed)
Doing well.  Call for any questions or concerns.

## 2015-05-05 DIAGNOSIS — A4902 Methicillin resistant Staphylococcus aureus infection, unspecified site: Secondary | ICD-10-CM | POA: Diagnosis not present

## 2015-05-05 DIAGNOSIS — E119 Type 2 diabetes mellitus without complications: Secondary | ICD-10-CM | POA: Diagnosis not present

## 2015-05-05 DIAGNOSIS — Z7984 Long term (current) use of oral hypoglycemic drugs: Secondary | ICD-10-CM | POA: Diagnosis not present

## 2015-05-05 DIAGNOSIS — Z794 Long term (current) use of insulin: Secondary | ICD-10-CM | POA: Diagnosis not present

## 2015-05-05 DIAGNOSIS — L02416 Cutaneous abscess of left lower limb: Secondary | ICD-10-CM | POA: Diagnosis not present

## 2015-05-05 NOTE — Progress Notes (Signed)
I had the pleasure of meeting with Laurie Palmer. She's been doing well with no specific complaints. She has been tolerating the wound VAC changes without difficulty. She specifically denies any fever, chills. Physical examination. Examination of the left posterior thigh was performed with a female chaperone in the room. The wound VAC is in place with a good seal and the wound is clearly significantly smaller. We discussed discontinuation of the wound vac and switching to wet and dry dressing changes. Her preference is to continue with the wound VAC. I'm comfortable with this decision. We will see her back in a few weeks and hopefully by then she will no longer need wound care. Additionally, we discussed that once the wound is completely healed we will plan to repeat an MRI scan of the thigh to evaluate the original lesion in question. She understands and agrees with that plan.

## 2015-05-06 ENCOUNTER — Encounter: Payer: Self-pay | Admitting: Oncology

## 2015-05-07 DIAGNOSIS — L02416 Cutaneous abscess of left lower limb: Secondary | ICD-10-CM | POA: Diagnosis not present

## 2015-05-07 DIAGNOSIS — A4902 Methicillin resistant Staphylococcus aureus infection, unspecified site: Secondary | ICD-10-CM | POA: Diagnosis not present

## 2015-05-07 DIAGNOSIS — Z794 Long term (current) use of insulin: Secondary | ICD-10-CM | POA: Diagnosis not present

## 2015-05-07 DIAGNOSIS — Z7984 Long term (current) use of oral hypoglycemic drugs: Secondary | ICD-10-CM | POA: Diagnosis not present

## 2015-05-07 DIAGNOSIS — E119 Type 2 diabetes mellitus without complications: Secondary | ICD-10-CM | POA: Diagnosis not present

## 2015-05-10 DIAGNOSIS — A4902 Methicillin resistant Staphylococcus aureus infection, unspecified site: Secondary | ICD-10-CM | POA: Diagnosis not present

## 2015-05-10 DIAGNOSIS — L02416 Cutaneous abscess of left lower limb: Secondary | ICD-10-CM | POA: Diagnosis not present

## 2015-05-10 DIAGNOSIS — Z794 Long term (current) use of insulin: Secondary | ICD-10-CM | POA: Diagnosis not present

## 2015-05-10 DIAGNOSIS — Z7984 Long term (current) use of oral hypoglycemic drugs: Secondary | ICD-10-CM | POA: Diagnosis not present

## 2015-05-10 DIAGNOSIS — E119 Type 2 diabetes mellitus without complications: Secondary | ICD-10-CM | POA: Diagnosis not present

## 2015-05-12 ENCOUNTER — Encounter: Payer: Self-pay | Admitting: Oncology

## 2015-05-12 DIAGNOSIS — A4902 Methicillin resistant Staphylococcus aureus infection, unspecified site: Secondary | ICD-10-CM | POA: Diagnosis not present

## 2015-05-12 DIAGNOSIS — Z7984 Long term (current) use of oral hypoglycemic drugs: Secondary | ICD-10-CM | POA: Diagnosis not present

## 2015-05-12 DIAGNOSIS — L02416 Cutaneous abscess of left lower limb: Secondary | ICD-10-CM | POA: Diagnosis not present

## 2015-05-12 DIAGNOSIS — E119 Type 2 diabetes mellitus without complications: Secondary | ICD-10-CM | POA: Diagnosis not present

## 2015-05-12 DIAGNOSIS — Z794 Long term (current) use of insulin: Secondary | ICD-10-CM | POA: Diagnosis not present

## 2015-05-14 ENCOUNTER — Telehealth: Payer: Self-pay | Admitting: Gynecologic Oncology

## 2015-05-14 DIAGNOSIS — Z794 Long term (current) use of insulin: Secondary | ICD-10-CM | POA: Diagnosis not present

## 2015-05-14 DIAGNOSIS — A4902 Methicillin resistant Staphylococcus aureus infection, unspecified site: Secondary | ICD-10-CM | POA: Diagnosis not present

## 2015-05-14 DIAGNOSIS — L02416 Cutaneous abscess of left lower limb: Secondary | ICD-10-CM | POA: Diagnosis not present

## 2015-05-14 DIAGNOSIS — Z7984 Long term (current) use of oral hypoglycemic drugs: Secondary | ICD-10-CM | POA: Diagnosis not present

## 2015-05-14 DIAGNOSIS — E119 Type 2 diabetes mellitus without complications: Secondary | ICD-10-CM | POA: Diagnosis not present

## 2015-05-14 NOTE — Telephone Encounter (Signed)
Called to check on patient's status.  Message left asking her to call the office for any questions or concerns.

## 2015-05-17 DIAGNOSIS — Z794 Long term (current) use of insulin: Secondary | ICD-10-CM | POA: Diagnosis not present

## 2015-05-17 DIAGNOSIS — L02416 Cutaneous abscess of left lower limb: Secondary | ICD-10-CM | POA: Diagnosis not present

## 2015-05-17 DIAGNOSIS — E119 Type 2 diabetes mellitus without complications: Secondary | ICD-10-CM | POA: Diagnosis not present

## 2015-05-17 DIAGNOSIS — A4902 Methicillin resistant Staphylococcus aureus infection, unspecified site: Secondary | ICD-10-CM | POA: Diagnosis not present

## 2015-05-17 DIAGNOSIS — Z7984 Long term (current) use of oral hypoglycemic drugs: Secondary | ICD-10-CM | POA: Diagnosis not present

## 2015-05-19 DIAGNOSIS — A4902 Methicillin resistant Staphylococcus aureus infection, unspecified site: Secondary | ICD-10-CM | POA: Diagnosis not present

## 2015-05-19 DIAGNOSIS — Z7984 Long term (current) use of oral hypoglycemic drugs: Secondary | ICD-10-CM | POA: Diagnosis not present

## 2015-05-19 DIAGNOSIS — E119 Type 2 diabetes mellitus without complications: Secondary | ICD-10-CM | POA: Diagnosis not present

## 2015-05-19 DIAGNOSIS — Z418 Encounter for other procedures for purposes other than remedying health state: Secondary | ICD-10-CM | POA: Diagnosis not present

## 2015-05-19 DIAGNOSIS — E1121 Type 2 diabetes mellitus with diabetic nephropathy: Secondary | ICD-10-CM | POA: Diagnosis not present

## 2015-05-19 DIAGNOSIS — Z794 Long term (current) use of insulin: Secondary | ICD-10-CM | POA: Diagnosis not present

## 2015-05-19 DIAGNOSIS — L02416 Cutaneous abscess of left lower limb: Secondary | ICD-10-CM | POA: Diagnosis not present

## 2015-05-21 DIAGNOSIS — E119 Type 2 diabetes mellitus without complications: Secondary | ICD-10-CM | POA: Diagnosis not present

## 2015-05-21 DIAGNOSIS — L02416 Cutaneous abscess of left lower limb: Secondary | ICD-10-CM | POA: Diagnosis not present

## 2015-05-21 DIAGNOSIS — Z794 Long term (current) use of insulin: Secondary | ICD-10-CM | POA: Diagnosis not present

## 2015-05-21 DIAGNOSIS — Z7984 Long term (current) use of oral hypoglycemic drugs: Secondary | ICD-10-CM | POA: Diagnosis not present

## 2015-05-21 DIAGNOSIS — A4902 Methicillin resistant Staphylococcus aureus infection, unspecified site: Secondary | ICD-10-CM | POA: Diagnosis not present

## 2015-05-24 DIAGNOSIS — Z7984 Long term (current) use of oral hypoglycemic drugs: Secondary | ICD-10-CM | POA: Diagnosis not present

## 2015-05-24 DIAGNOSIS — L02416 Cutaneous abscess of left lower limb: Secondary | ICD-10-CM | POA: Diagnosis not present

## 2015-05-24 DIAGNOSIS — E119 Type 2 diabetes mellitus without complications: Secondary | ICD-10-CM | POA: Diagnosis not present

## 2015-05-24 DIAGNOSIS — A4902 Methicillin resistant Staphylococcus aureus infection, unspecified site: Secondary | ICD-10-CM | POA: Diagnosis not present

## 2015-05-24 DIAGNOSIS — Z794 Long term (current) use of insulin: Secondary | ICD-10-CM | POA: Diagnosis not present

## 2015-05-25 ENCOUNTER — Encounter: Payer: Self-pay | Admitting: General Surgery

## 2015-05-25 ENCOUNTER — Ambulatory Visit (HOSPITAL_BASED_OUTPATIENT_CLINIC_OR_DEPARTMENT_OTHER): Payer: Medicare Other | Admitting: General Surgery

## 2015-05-25 VITALS — BP 130/83 | HR 87 | Temp 97.9°F | Resp 18 | Ht 68.5 in | Wt 264.8 lb

## 2015-05-25 DIAGNOSIS — L02416 Cutaneous abscess of left lower limb: Secondary | ICD-10-CM

## 2015-05-25 NOTE — Patient Instructions (Signed)
Begin doing twice daily dressing changes.  Follow up on Jan 31 or sooner if needed

## 2015-05-25 NOTE — Progress Notes (Signed)
The patient is here in routine f/u.   No complaints.  Ready for wound vac removal. Exam with female chaperone in the room. 2.5x1.5cm shallow (31mm deep) granulation tissue. No erythema/warmth or discharge. Removed and wet to dry dressings placed. A:  Pt doing well. P: Will plan on dressing changes. See back in 3 weeks with repeat exam and if wound nearly closed then schedule f/u MRI of thigh.

## 2015-05-26 DIAGNOSIS — Z7984 Long term (current) use of oral hypoglycemic drugs: Secondary | ICD-10-CM | POA: Diagnosis not present

## 2015-05-26 DIAGNOSIS — L02416 Cutaneous abscess of left lower limb: Secondary | ICD-10-CM | POA: Diagnosis not present

## 2015-05-26 DIAGNOSIS — Z4902 Encounter for fitting and adjustment of peritoneal dialysis catheter: Secondary | ICD-10-CM | POA: Diagnosis not present

## 2015-05-26 DIAGNOSIS — E119 Type 2 diabetes mellitus without complications: Secondary | ICD-10-CM | POA: Diagnosis not present

## 2015-05-26 DIAGNOSIS — Z794 Long term (current) use of insulin: Secondary | ICD-10-CM | POA: Diagnosis not present

## 2015-05-28 DIAGNOSIS — E119 Type 2 diabetes mellitus without complications: Secondary | ICD-10-CM | POA: Diagnosis not present

## 2015-05-28 DIAGNOSIS — L02416 Cutaneous abscess of left lower limb: Secondary | ICD-10-CM | POA: Diagnosis not present

## 2015-05-28 DIAGNOSIS — Z794 Long term (current) use of insulin: Secondary | ICD-10-CM | POA: Diagnosis not present

## 2015-05-28 DIAGNOSIS — Z7984 Long term (current) use of oral hypoglycemic drugs: Secondary | ICD-10-CM | POA: Diagnosis not present

## 2015-05-28 DIAGNOSIS — Z4902 Encounter for fitting and adjustment of peritoneal dialysis catheter: Secondary | ICD-10-CM | POA: Diagnosis not present

## 2015-05-31 DIAGNOSIS — Z4902 Encounter for fitting and adjustment of peritoneal dialysis catheter: Secondary | ICD-10-CM | POA: Diagnosis not present

## 2015-05-31 DIAGNOSIS — Z7984 Long term (current) use of oral hypoglycemic drugs: Secondary | ICD-10-CM | POA: Diagnosis not present

## 2015-05-31 DIAGNOSIS — Z794 Long term (current) use of insulin: Secondary | ICD-10-CM | POA: Diagnosis not present

## 2015-05-31 DIAGNOSIS — E119 Type 2 diabetes mellitus without complications: Secondary | ICD-10-CM | POA: Diagnosis not present

## 2015-05-31 DIAGNOSIS — L02416 Cutaneous abscess of left lower limb: Secondary | ICD-10-CM | POA: Diagnosis not present

## 2015-06-03 DIAGNOSIS — E119 Type 2 diabetes mellitus without complications: Secondary | ICD-10-CM | POA: Diagnosis not present

## 2015-06-03 DIAGNOSIS — Z4902 Encounter for fitting and adjustment of peritoneal dialysis catheter: Secondary | ICD-10-CM | POA: Diagnosis not present

## 2015-06-03 DIAGNOSIS — Z7984 Long term (current) use of oral hypoglycemic drugs: Secondary | ICD-10-CM | POA: Diagnosis not present

## 2015-06-03 DIAGNOSIS — L02416 Cutaneous abscess of left lower limb: Secondary | ICD-10-CM | POA: Diagnosis not present

## 2015-06-03 DIAGNOSIS — Z794 Long term (current) use of insulin: Secondary | ICD-10-CM | POA: Diagnosis not present

## 2015-06-07 ENCOUNTER — Telehealth: Payer: Self-pay | Admitting: Gynecologic Oncology

## 2015-06-07 DIAGNOSIS — Z7984 Long term (current) use of oral hypoglycemic drugs: Secondary | ICD-10-CM | POA: Diagnosis not present

## 2015-06-07 DIAGNOSIS — Z794 Long term (current) use of insulin: Secondary | ICD-10-CM | POA: Diagnosis not present

## 2015-06-07 DIAGNOSIS — Z4902 Encounter for fitting and adjustment of peritoneal dialysis catheter: Secondary | ICD-10-CM | POA: Diagnosis not present

## 2015-06-07 DIAGNOSIS — L02416 Cutaneous abscess of left lower limb: Secondary | ICD-10-CM | POA: Diagnosis not present

## 2015-06-07 DIAGNOSIS — E119 Type 2 diabetes mellitus without complications: Secondary | ICD-10-CM | POA: Diagnosis not present

## 2015-06-07 NOTE — Telephone Encounter (Signed)
Returned call to home health RN.  Advised her that home health services could be discontinued per Dr. Rolanda Jay at his last visit with Ms. Laurie Palmer.  Advised to call for any questions or concerns.

## 2015-06-07 NOTE — Telephone Encounter (Signed)
Called patient to check on current status.  Patient stating she is doing well.  Patient stating home health RN continues to come out after being told services can be discontinued because she says her contract has not run out and she needs to keep measurements on the wound per pt.  Advised patient to have RN call the office today to discuss.  Patient is able to change the dressing and states she does not feel she needs home health at this time.  Follow up appt arranged for Jan 31 with Dr. Rolanda Jay.  Advised to call for any questions or concerns.

## 2015-06-11 DIAGNOSIS — Z794 Long term (current) use of insulin: Secondary | ICD-10-CM | POA: Diagnosis not present

## 2015-06-11 DIAGNOSIS — Z4902 Encounter for fitting and adjustment of peritoneal dialysis catheter: Secondary | ICD-10-CM | POA: Diagnosis not present

## 2015-06-11 DIAGNOSIS — E119 Type 2 diabetes mellitus without complications: Secondary | ICD-10-CM | POA: Diagnosis not present

## 2015-06-11 DIAGNOSIS — L02416 Cutaneous abscess of left lower limb: Secondary | ICD-10-CM | POA: Diagnosis not present

## 2015-06-11 DIAGNOSIS — Z7984 Long term (current) use of oral hypoglycemic drugs: Secondary | ICD-10-CM | POA: Diagnosis not present

## 2015-06-15 ENCOUNTER — Ambulatory Visit (HOSPITAL_BASED_OUTPATIENT_CLINIC_OR_DEPARTMENT_OTHER): Payer: Medicare Other | Admitting: General Surgery

## 2015-06-15 VITALS — BP 116/74 | HR 79 | Temp 97.6°F | Wt 269.0 lb

## 2015-06-15 DIAGNOSIS — R2242 Localized swelling, mass and lump, left lower limb: Secondary | ICD-10-CM

## 2015-06-15 DIAGNOSIS — L02416 Cutaneous abscess of left lower limb: Secondary | ICD-10-CM

## 2015-06-15 NOTE — Progress Notes (Signed)
Abscess of left leg The patient presents today in routine f/u for a wound check. On examination with a female chaperone in the room, her posterior left thigh is healing well with approximately 1.5cm of healthy granulation tissue.   A:  Patient originally presented with a large mass involving the left thigh complicated by an abscess of the posterior portion requiring an Incision and Drainage procedure with wound vac placement. P:  We will allow approximately two additional weeks of wound healing then obtain a MRI scan of the thigh in f/u to assess the original large mass for which she was originally referred.  We will then present her at our [place] tumor board and then see her back in clinic for final treatment planning.  The patient understands and agrees with the plan as outlined above.

## 2015-06-15 NOTE — Assessment & Plan Note (Signed)
The patient presents today in routine f/u for a wound check. On examination with a female chaperone in the room, her posterior left thigh is healing well with approximately 1.5cm of healthy granulation tissue.   A:  Patient originally presented with a large mass involving the left thigh complicated by an abscess of the posterior portion requiring an Incision and Drainage procedure with wound vac placement. P:  We will allow approximately two additional weeks of wound healing then obtain a MRI scan of the thigh in f/u to assess the original large mass for which she was originally referred.  We will then present her at our [place] tumor board and then see her back in clinic for final treatment planning.  The patient understands and agrees with the plan as outlined above.

## 2015-06-15 NOTE — Patient Instructions (Signed)
Plan to have the MRI of your left leg on February 13 at [facility].  Your case will be discussed at the Multidisciplinary Conference on February 28 tentatively.  Plan to follow up with Dr. Rolanda Jay on Jul 13, 2015.

## 2015-06-28 ENCOUNTER — Ambulatory Visit (HOSPITAL_COMMUNITY)
Admission: RE | Admit: 2015-06-28 | Discharge: 2015-06-28 | Disposition: A | Discharge status: Home / Self Care | Payer: Medicare Other | Source: Ambulatory Visit | Attending: Gynecologic Oncology | Admitting: Gynecologic Oncology

## 2015-06-28 DIAGNOSIS — L02416 Cutaneous abscess of left lower limb: Secondary | ICD-10-CM | POA: Diagnosis not present

## 2015-06-28 DIAGNOSIS — R2242 Localized swelling, mass and lump, left lower limb: Secondary | ICD-10-CM | POA: Insufficient documentation

## 2015-06-28 DIAGNOSIS — M25461 Effusion, right knee: Secondary | ICD-10-CM | POA: Insufficient documentation

## 2015-06-28 DIAGNOSIS — Z85038 Personal history of other malignant neoplasm of large intestine: Secondary | ICD-10-CM | POA: Insufficient documentation

## 2015-06-28 DIAGNOSIS — Z9889 Other specified postprocedural states: Secondary | ICD-10-CM | POA: Diagnosis not present

## 2015-06-28 DIAGNOSIS — M25462 Effusion, left knee: Secondary | ICD-10-CM | POA: Diagnosis not present

## 2015-06-28 LAB — POCT I-STAT CREATININE: Creatinine, Ser: 0.6 mg/dL (ref 0.44–1.00)

## 2015-06-28 MED ORDER — GADOBENATE DIMEGLUMINE 529 MG/ML IV SOLN
20.0000 mL | Freq: Once | INTRAVENOUS | Status: AC | PRN
  Administered 2015-06-28: 20 mL via INTRAVENOUS

## 2015-06-30 DIAGNOSIS — E1121 Type 2 diabetes mellitus with diabetic nephropathy: Secondary | ICD-10-CM | POA: Diagnosis not present

## 2015-07-08 DIAGNOSIS — C189 Malignant neoplasm of colon, unspecified: Secondary | ICD-10-CM | POA: Diagnosis not present

## 2015-07-13 ENCOUNTER — Ambulatory Visit (HOSPITAL_BASED_OUTPATIENT_CLINIC_OR_DEPARTMENT_OTHER): Payer: Medicare Other | Admitting: General Surgery

## 2015-07-13 ENCOUNTER — Encounter: Payer: Self-pay | Admitting: General Surgery

## 2015-07-13 VITALS — BP 125/73 | HR 101 | Temp 97.9°F | Resp 18 | Wt 275.4 lb

## 2015-07-13 DIAGNOSIS — L02416 Cutaneous abscess of left lower limb: Secondary | ICD-10-CM | POA: Diagnosis present

## 2015-07-13 NOTE — Progress Notes (Signed)
Laurie Palmer is here in follow-up status post MRI scan of the femur following incision and drainage of posterior abscess with interval resolution of thigh swelling. Today she has no specific complaints. We had presented her case at our tumor multidisciplinary conference and there is a complete resolution of the fluid collection within the thigh. There are some residual changes in the thigh but appear more consistent with inflammatory/scar tissue. The consensus was to repeat an MRI scan in 6 months with physical examination as there appears to be a low suspicion of malignancy at this point. This was explained to the patient she agrees with the plan as outlined above. She understands to call us immediately if she has any fever or chills, swelling of the thigh or increase in pain. We will plan to see the patient back in 6 months with MRI scan of the femur.

## 2015-07-13 NOTE — Patient Instructions (Signed)
Plan for an MRI of the left leg on September 5 with a follow up appointment to see Dr. Rolanda Jay on September 12.  Please call for any questions or concerns.  Arrive at 12:30pm for lab work at [facility].

## 2015-07-16 ENCOUNTER — Telehealth: Payer: Self-pay

## 2015-07-16 NOTE — Telephone Encounter (Signed)
Patient called requesting a letter for work release for the [employer] Fax: [phone]. Patient's request for letter was written and faxed to Fax: [phone] . Confirmation verification received back "OK" . The patient is aware of the letter being completed and faxed , patient denies further questions or concerns at this time.

## 2015-08-19 DIAGNOSIS — Z789 Other specified health status: Secondary | ICD-10-CM | POA: Diagnosis not present

## 2015-08-19 DIAGNOSIS — E7800 Pure hypercholesterolemia, unspecified: Secondary | ICD-10-CM | POA: Diagnosis not present

## 2015-08-19 DIAGNOSIS — E1121 Type 2 diabetes mellitus with diabetic nephropathy: Secondary | ICD-10-CM | POA: Diagnosis not present

## 2015-08-19 DIAGNOSIS — Z6841 Body Mass Index (BMI) 40.0 and over, adult: Secondary | ICD-10-CM | POA: Diagnosis not present

## 2015-09-09 DIAGNOSIS — E1121 Type 2 diabetes mellitus with diabetic nephropathy: Secondary | ICD-10-CM | POA: Diagnosis not present

## 2015-09-09 DIAGNOSIS — E7800 Pure hypercholesterolemia, unspecified: Secondary | ICD-10-CM | POA: Diagnosis not present

## 2016-01-18 ENCOUNTER — Ambulatory Visit (HOSPITAL_COMMUNITY)
Admission: RE | Admit: 2016-01-18 | Discharge: 2016-01-18 | Disposition: A | Discharge status: Home / Self Care | Payer: Medicare Other | Source: Ambulatory Visit | Attending: Gynecologic Oncology | Admitting: Gynecologic Oncology

## 2016-01-18 ENCOUNTER — Encounter (HOSPITAL_COMMUNITY): Payer: Self-pay | Admitting: Radiology

## 2016-01-18 DIAGNOSIS — R2242 Localized swelling, mass and lump, left lower limb: Secondary | ICD-10-CM | POA: Diagnosis not present

## 2016-01-18 DIAGNOSIS — L02416 Cutaneous abscess of left lower limb: Secondary | ICD-10-CM | POA: Diagnosis not present

## 2016-01-18 LAB — POCT I-STAT CREATININE: CREATININE: 0.6 mg/dL (ref 0.44–1.00)

## 2016-01-18 MED ORDER — GADOBENATE DIMEGLUMINE 529 MG/ML IV SOLN
20.0000 mL | Freq: Once | INTRAVENOUS | Status: AC | PRN
  Administered 2016-01-18: 20 mL via INTRAVENOUS

## 2016-01-24 DIAGNOSIS — Z1231 Encounter for screening mammogram for malignant neoplasm of breast: Secondary | ICD-10-CM | POA: Diagnosis not present

## 2016-01-25 ENCOUNTER — Encounter: Payer: Self-pay | Admitting: General Surgery

## 2016-01-25 ENCOUNTER — Ambulatory Visit (HOSPITAL_BASED_OUTPATIENT_CLINIC_OR_DEPARTMENT_OTHER): Payer: Medicare Other | Admitting: General Surgery

## 2016-01-25 VITALS — BP 123/71 | HR 86 | Temp 97.6°F | Wt 271.6 lb

## 2016-01-25 DIAGNOSIS — L02416 Cutaneous abscess of left lower limb: Secondary | ICD-10-CM

## 2016-01-25 NOTE — Progress Notes (Signed)
I had the pleasure of meeting with Laurie Palmer today in routine follow-up status post left posterior thigh incision and drainage of an abscess. She reports no specific complaints and her wound is completely healed.  Physical examination: Examination was performed with a female chaperone in the room Focused examination of the left posterior thigh reveals a well-healed incision there is some scaliness in the central portion but no evidence of mass or recurrence. Of note her left thigh has now returned to a normal circumference.  Radiologic studies: MRI scan of the left femur with and without contrast dated 01/18/2016. Spiculated band of T1 and T2 low signal with enhancement on postcontrast imaging extending from the left posterior mid thigh tracking anteriorly along the medial margin of the left short and biceps femorus muscle, along the medial margin of the proximal femoral diaphysis and into the left vastus intermedius muscle and along the medial margin of the vastus medialis muscle consistent with scarring. No recurrent fluid collection or soft tissue masses.  Impression: Resolved left thigh abscess without evidence of soft tissue mass.  Plan: An extended discussion with the patient reviewing the MRI findings. She is comfortable with following up with [place] on an as-needed basis from this point forward. She understands if she has any symptoms or concerns whatsoever to contact us immediately.

## 2016-03-08 DIAGNOSIS — C189 Malignant neoplasm of colon, unspecified: Secondary | ICD-10-CM | POA: Diagnosis not present

## 2016-03-08 DIAGNOSIS — Z6841 Body Mass Index (BMI) 40.0 and over, adult: Secondary | ICD-10-CM | POA: Diagnosis not present

## 2016-03-08 DIAGNOSIS — E1121 Type 2 diabetes mellitus with diabetic nephropathy: Secondary | ICD-10-CM | POA: Diagnosis not present

## 2016-03-08 DIAGNOSIS — Z9114 Patient's other noncompliance with medication regimen: Secondary | ICD-10-CM | POA: Diagnosis not present

## 2016-03-08 DIAGNOSIS — Z299 Encounter for prophylactic measures, unspecified: Secondary | ICD-10-CM | POA: Diagnosis not present

## 2016-03-22 DIAGNOSIS — Z713 Dietary counseling and surveillance: Secondary | ICD-10-CM | POA: Diagnosis not present

## 2016-03-22 DIAGNOSIS — E7800 Pure hypercholesterolemia, unspecified: Secondary | ICD-10-CM | POA: Diagnosis not present

## 2016-03-22 DIAGNOSIS — Z6839 Body mass index (BMI) 39.0-39.9, adult: Secondary | ICD-10-CM | POA: Diagnosis not present

## 2016-03-22 DIAGNOSIS — E1121 Type 2 diabetes mellitus with diabetic nephropathy: Secondary | ICD-10-CM | POA: Diagnosis not present

## 2016-03-22 DIAGNOSIS — Z299 Encounter for prophylactic measures, unspecified: Secondary | ICD-10-CM | POA: Diagnosis not present

## 2016-04-04 DIAGNOSIS — R5383 Other fatigue: Secondary | ICD-10-CM | POA: Diagnosis not present

## 2016-04-04 DIAGNOSIS — Z0000 Encounter for general adult medical examination without abnormal findings: Secondary | ICD-10-CM | POA: Diagnosis not present

## 2016-04-04 DIAGNOSIS — E7800 Pure hypercholesterolemia, unspecified: Secondary | ICD-10-CM | POA: Diagnosis not present

## 2016-04-04 DIAGNOSIS — Z7189 Other specified counseling: Secondary | ICD-10-CM | POA: Diagnosis not present

## 2016-04-04 DIAGNOSIS — Z299 Encounter for prophylactic measures, unspecified: Secondary | ICD-10-CM | POA: Diagnosis not present

## 2016-04-04 DIAGNOSIS — E559 Vitamin D deficiency, unspecified: Secondary | ICD-10-CM | POA: Diagnosis not present

## 2016-04-04 DIAGNOSIS — Z1211 Encounter for screening for malignant neoplasm of colon: Secondary | ICD-10-CM | POA: Diagnosis not present

## 2016-04-04 DIAGNOSIS — E1121 Type 2 diabetes mellitus with diabetic nephropathy: Secondary | ICD-10-CM | POA: Diagnosis not present

## 2016-04-04 DIAGNOSIS — Z6841 Body Mass Index (BMI) 40.0 and over, adult: Secondary | ICD-10-CM | POA: Diagnosis not present

## 2016-04-04 DIAGNOSIS — Z1389 Encounter for screening for other disorder: Secondary | ICD-10-CM | POA: Diagnosis not present

## 2016-04-04 DIAGNOSIS — Z79899 Other long term (current) drug therapy: Secondary | ICD-10-CM | POA: Diagnosis not present

## 2016-05-02 DIAGNOSIS — E2839 Other primary ovarian failure: Secondary | ICD-10-CM | POA: Diagnosis not present

## 2016-05-25 DIAGNOSIS — Z6841 Body Mass Index (BMI) 40.0 and over, adult: Secondary | ICD-10-CM | POA: Diagnosis not present

## 2016-05-25 DIAGNOSIS — Z299 Encounter for prophylactic measures, unspecified: Secondary | ICD-10-CM | POA: Diagnosis not present

## 2016-05-25 DIAGNOSIS — C189 Malignant neoplasm of colon, unspecified: Secondary | ICD-10-CM | POA: Diagnosis not present

## 2016-05-25 DIAGNOSIS — E1121 Type 2 diabetes mellitus with diabetic nephropathy: Secondary | ICD-10-CM | POA: Diagnosis not present

## 2016-05-25 DIAGNOSIS — Z6838 Body mass index (BMI) 38.0-38.9, adult: Secondary | ICD-10-CM | POA: Diagnosis not present

## 2016-05-25 DIAGNOSIS — Z789 Other specified health status: Secondary | ICD-10-CM | POA: Diagnosis not present

## 2016-06-15 DIAGNOSIS — E7800 Pure hypercholesterolemia, unspecified: Secondary | ICD-10-CM | POA: Diagnosis not present

## 2016-06-15 DIAGNOSIS — Z299 Encounter for prophylactic measures, unspecified: Secondary | ICD-10-CM | POA: Diagnosis not present

## 2016-06-15 DIAGNOSIS — Z713 Dietary counseling and surveillance: Secondary | ICD-10-CM | POA: Diagnosis not present

## 2016-06-15 DIAGNOSIS — Z789 Other specified health status: Secondary | ICD-10-CM | POA: Diagnosis not present

## 2016-06-15 DIAGNOSIS — Z6839 Body mass index (BMI) 39.0-39.9, adult: Secondary | ICD-10-CM | POA: Diagnosis not present

## 2016-06-15 DIAGNOSIS — E1121 Type 2 diabetes mellitus with diabetic nephropathy: Secondary | ICD-10-CM | POA: Diagnosis not present

## 2016-06-29 DIAGNOSIS — C189 Malignant neoplasm of colon, unspecified: Secondary | ICD-10-CM | POA: Diagnosis not present

## 2016-06-29 DIAGNOSIS — Z6839 Body mass index (BMI) 39.0-39.9, adult: Secondary | ICD-10-CM | POA: Diagnosis not present

## 2016-06-29 DIAGNOSIS — Z6841 Body Mass Index (BMI) 40.0 and over, adult: Secondary | ICD-10-CM | POA: Diagnosis not present

## 2016-06-29 DIAGNOSIS — E1121 Type 2 diabetes mellitus with diabetic nephropathy: Secondary | ICD-10-CM | POA: Diagnosis not present

## 2016-06-29 DIAGNOSIS — Z299 Encounter for prophylactic measures, unspecified: Secondary | ICD-10-CM | POA: Diagnosis not present

## 2016-07-12 DIAGNOSIS — E119 Type 2 diabetes mellitus without complications: Secondary | ICD-10-CM | POA: Diagnosis not present

## 2016-07-12 DIAGNOSIS — E7800 Pure hypercholesterolemia, unspecified: Secondary | ICD-10-CM | POA: Diagnosis not present

## 2016-09-21 DIAGNOSIS — Z299 Encounter for prophylactic measures, unspecified: Secondary | ICD-10-CM | POA: Diagnosis not present

## 2016-09-21 DIAGNOSIS — Z713 Dietary counseling and surveillance: Secondary | ICD-10-CM | POA: Diagnosis not present

## 2016-09-21 DIAGNOSIS — Z6839 Body mass index (BMI) 39.0-39.9, adult: Secondary | ICD-10-CM | POA: Diagnosis not present

## 2016-09-21 DIAGNOSIS — E7800 Pure hypercholesterolemia, unspecified: Secondary | ICD-10-CM | POA: Diagnosis not present

## 2016-09-21 DIAGNOSIS — E1129 Type 2 diabetes mellitus with other diabetic kidney complication: Secondary | ICD-10-CM | POA: Diagnosis not present

## 2016-09-21 DIAGNOSIS — Z6841 Body Mass Index (BMI) 40.0 and over, adult: Secondary | ICD-10-CM | POA: Diagnosis not present

## 2016-09-21 DIAGNOSIS — C189 Malignant neoplasm of colon, unspecified: Secondary | ICD-10-CM | POA: Diagnosis not present

## 2016-11-02 DIAGNOSIS — Z713 Dietary counseling and surveillance: Secondary | ICD-10-CM | POA: Diagnosis not present

## 2016-11-02 DIAGNOSIS — Z299 Encounter for prophylactic measures, unspecified: Secondary | ICD-10-CM | POA: Diagnosis not present

## 2016-11-02 DIAGNOSIS — E1165 Type 2 diabetes mellitus with hyperglycemia: Secondary | ICD-10-CM | POA: Diagnosis not present

## 2016-11-02 DIAGNOSIS — Z6838 Body mass index (BMI) 38.0-38.9, adult: Secondary | ICD-10-CM | POA: Diagnosis not present

## 2016-11-02 DIAGNOSIS — C189 Malignant neoplasm of colon, unspecified: Secondary | ICD-10-CM | POA: Diagnosis not present

## 2016-11-02 DIAGNOSIS — E7800 Pure hypercholesterolemia, unspecified: Secondary | ICD-10-CM | POA: Diagnosis not present

## 2016-11-02 DIAGNOSIS — Z6841 Body Mass Index (BMI) 40.0 and over, adult: Secondary | ICD-10-CM | POA: Diagnosis not present

## 2016-11-29 DIAGNOSIS — E7800 Pure hypercholesterolemia, unspecified: Secondary | ICD-10-CM | POA: Diagnosis not present

## 2016-11-29 DIAGNOSIS — E119 Type 2 diabetes mellitus without complications: Secondary | ICD-10-CM | POA: Diagnosis not present

## 2016-12-11 DIAGNOSIS — E1165 Type 2 diabetes mellitus with hyperglycemia: Secondary | ICD-10-CM | POA: Diagnosis not present

## 2016-12-11 DIAGNOSIS — Z299 Encounter for prophylactic measures, unspecified: Secondary | ICD-10-CM | POA: Diagnosis not present

## 2016-12-11 DIAGNOSIS — Z6838 Body mass index (BMI) 38.0-38.9, adult: Secondary | ICD-10-CM | POA: Diagnosis not present

## 2016-12-11 DIAGNOSIS — C189 Malignant neoplasm of colon, unspecified: Secondary | ICD-10-CM | POA: Diagnosis not present

## 2016-12-11 DIAGNOSIS — Z713 Dietary counseling and surveillance: Secondary | ICD-10-CM | POA: Diagnosis not present

## 2016-12-11 DIAGNOSIS — E7800 Pure hypercholesterolemia, unspecified: Secondary | ICD-10-CM | POA: Diagnosis not present

## 2016-12-11 DIAGNOSIS — Z6841 Body Mass Index (BMI) 40.0 and over, adult: Secondary | ICD-10-CM | POA: Diagnosis not present

## 2017-01-02 DIAGNOSIS — E1165 Type 2 diabetes mellitus with hyperglycemia: Secondary | ICD-10-CM | POA: Diagnosis not present

## 2017-01-02 DIAGNOSIS — Z6838 Body mass index (BMI) 38.0-38.9, adult: Secondary | ICD-10-CM | POA: Diagnosis not present

## 2017-01-02 DIAGNOSIS — Z299 Encounter for prophylactic measures, unspecified: Secondary | ICD-10-CM | POA: Diagnosis not present

## 2017-01-02 DIAGNOSIS — E669 Obesity, unspecified: Secondary | ICD-10-CM | POA: Diagnosis not present

## 2017-01-02 DIAGNOSIS — E7800 Pure hypercholesterolemia, unspecified: Secondary | ICD-10-CM | POA: Diagnosis not present

## 2017-01-02 DIAGNOSIS — C189 Malignant neoplasm of colon, unspecified: Secondary | ICD-10-CM | POA: Diagnosis not present

## 2017-04-10 DIAGNOSIS — E7800 Pure hypercholesterolemia, unspecified: Secondary | ICD-10-CM | POA: Diagnosis not present

## 2017-04-10 DIAGNOSIS — Z1339 Encounter for screening examination for other mental health and behavioral disorders: Secondary | ICD-10-CM | POA: Diagnosis not present

## 2017-04-10 DIAGNOSIS — Z6839 Body mass index (BMI) 39.0-39.9, adult: Secondary | ICD-10-CM | POA: Diagnosis not present

## 2017-04-10 DIAGNOSIS — R5383 Other fatigue: Secondary | ICD-10-CM | POA: Diagnosis not present

## 2017-04-10 DIAGNOSIS — Z79899 Other long term (current) drug therapy: Secondary | ICD-10-CM | POA: Diagnosis not present

## 2017-04-10 DIAGNOSIS — Z0000 Encounter for general adult medical examination without abnormal findings: Secondary | ICD-10-CM | POA: Diagnosis not present

## 2017-04-10 DIAGNOSIS — Z1211 Encounter for screening for malignant neoplasm of colon: Secondary | ICD-10-CM | POA: Diagnosis not present

## 2017-04-10 DIAGNOSIS — E559 Vitamin D deficiency, unspecified: Secondary | ICD-10-CM | POA: Diagnosis not present

## 2017-04-10 DIAGNOSIS — Z299 Encounter for prophylactic measures, unspecified: Secondary | ICD-10-CM | POA: Diagnosis not present

## 2017-04-10 DIAGNOSIS — Z1331 Encounter for screening for depression: Secondary | ICD-10-CM | POA: Diagnosis not present

## 2017-04-10 DIAGNOSIS — E1165 Type 2 diabetes mellitus with hyperglycemia: Secondary | ICD-10-CM | POA: Diagnosis not present

## 2017-04-10 DIAGNOSIS — Z7189 Other specified counseling: Secondary | ICD-10-CM | POA: Diagnosis not present

## 2017-04-26 DIAGNOSIS — Z1231 Encounter for screening mammogram for malignant neoplasm of breast: Secondary | ICD-10-CM | POA: Diagnosis not present

## 2017-05-10 DIAGNOSIS — Z299 Encounter for prophylactic measures, unspecified: Secondary | ICD-10-CM | POA: Diagnosis not present

## 2017-05-10 DIAGNOSIS — Z6841 Body Mass Index (BMI) 40.0 and over, adult: Secondary | ICD-10-CM | POA: Diagnosis not present

## 2017-05-10 DIAGNOSIS — Z6839 Body mass index (BMI) 39.0-39.9, adult: Secondary | ICD-10-CM | POA: Diagnosis not present

## 2017-05-10 DIAGNOSIS — E1165 Type 2 diabetes mellitus with hyperglycemia: Secondary | ICD-10-CM | POA: Diagnosis not present

## 2017-05-10 DIAGNOSIS — C189 Malignant neoplasm of colon, unspecified: Secondary | ICD-10-CM | POA: Diagnosis not present

## 2017-05-15 IMAGING — MR MR FEMUR*L* WO/W CM
4 of 8 series · 17 of 40 positions shown · IV contrast (multihance)
Comparison: 03/02/2015

CLINICAL DATA: Left posterior thigh mass distally, status post
incision and drainage of left thigh abscess. History of colon
cancer.

EXAM:
MR OF THE LEFT LOWER EXTREMITY WITHOUT AND WITH CONTRAST
TECHNIQUE: Multiplanar, multisequence MR imaging of the lower left extremity
was performed both before and after administration of intravenous
contrast.
CONTRAST:  20mL MULTIHANCE GADOBENATE DIMEGLUMINE 529 MG/ML IV SOLN

[Series 5: T1 · axial · 5.0mm · 0.51mm/px · z∈[-170,+213]mm · 4 of 60 slices shown]
[im 1/60]
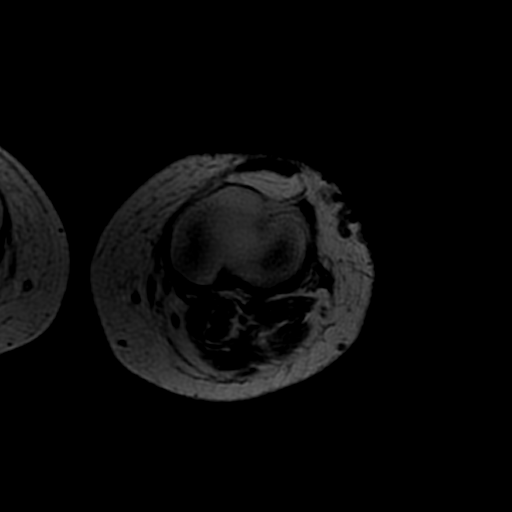
[im 12/60]
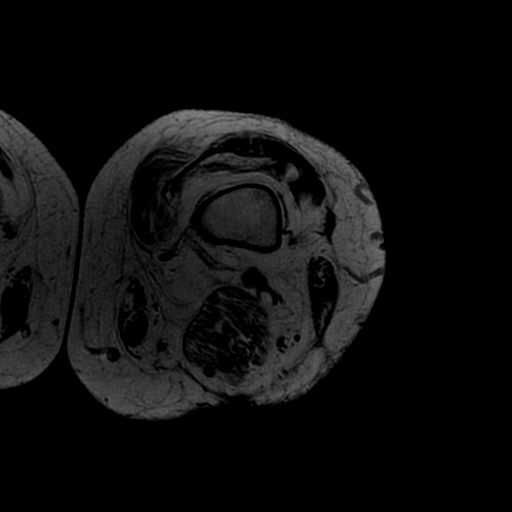
[im 36/60]
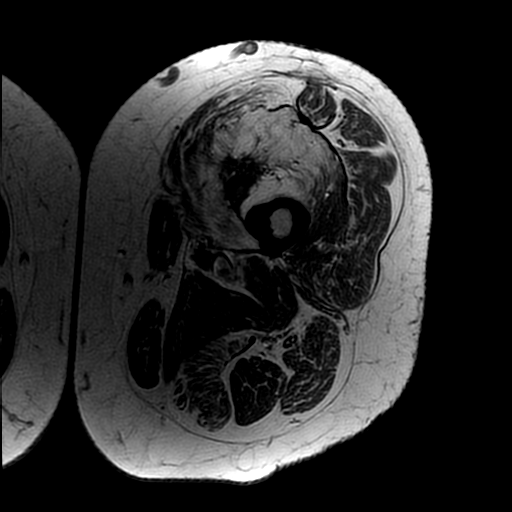
[im 60/60]
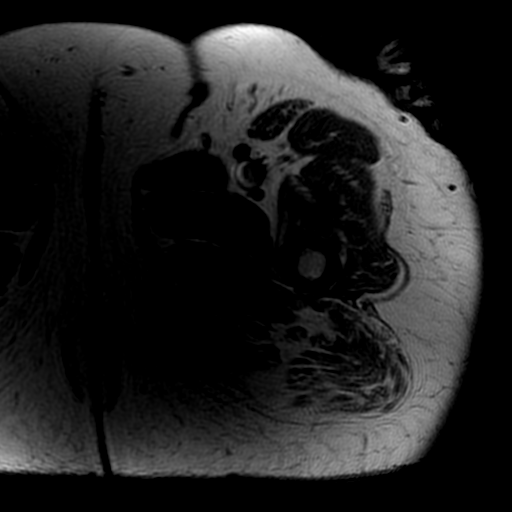

[Series 6: T2 fat-sat · axial · 5.0mm · 0.51mm/px · z∈[-170,+213]mm · 6 of 60 slices shown (1 of 2)]
[im 1/60]
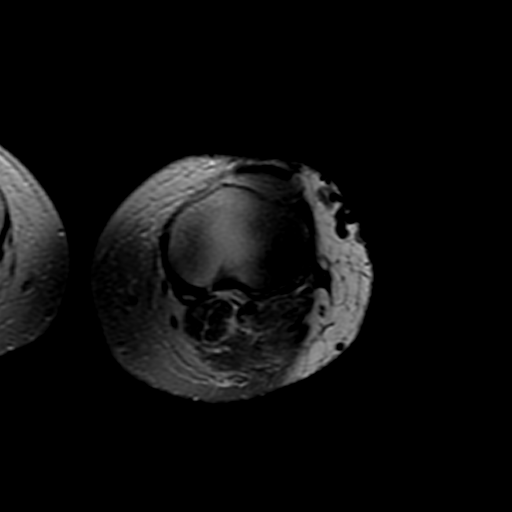
[im 12/60]
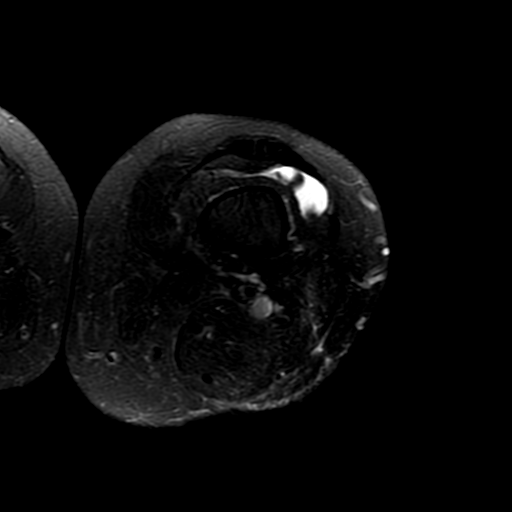
[im 24/60]
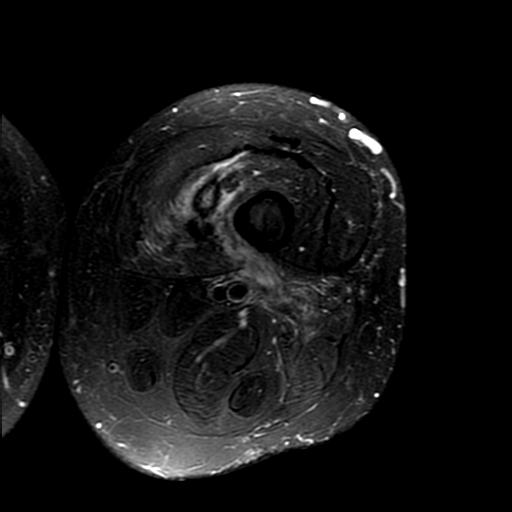
[im 36/60]
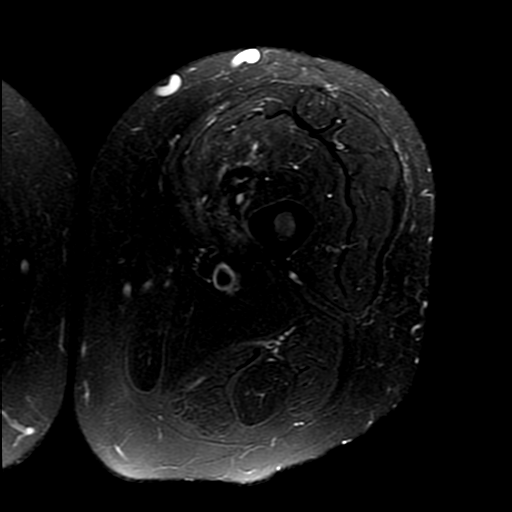
[im 48/60]
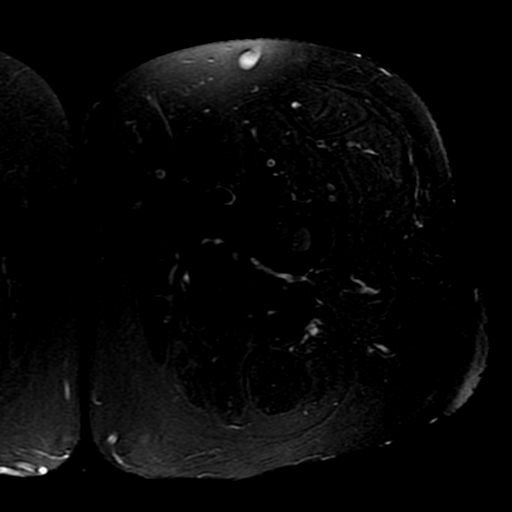
[im 60/60]
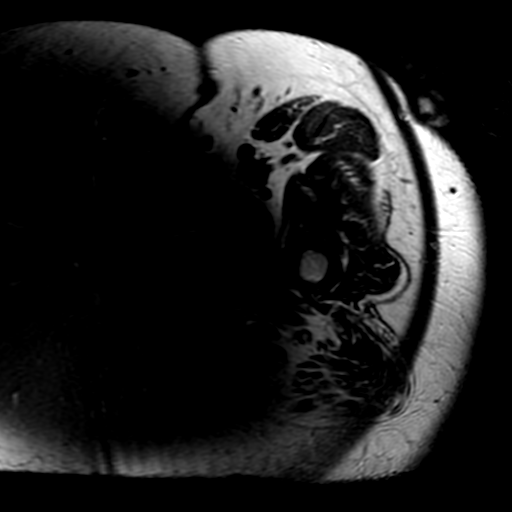

[Series 7: T1 fat-sat · axial · 5.0mm · 0.51mm/px · z∈[-99,+213]mm · 3 of 60 slices shown]
[im 12/60]
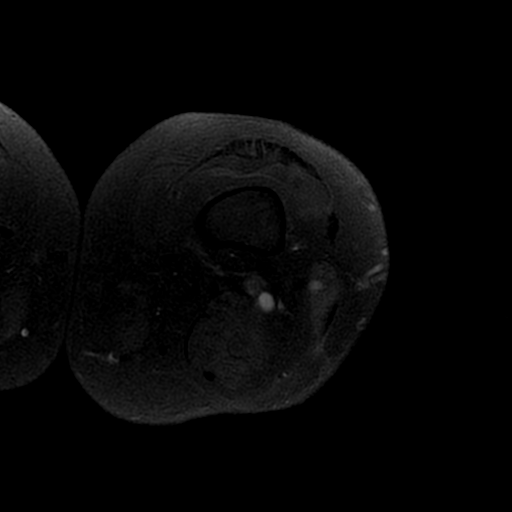
[im 36/60]
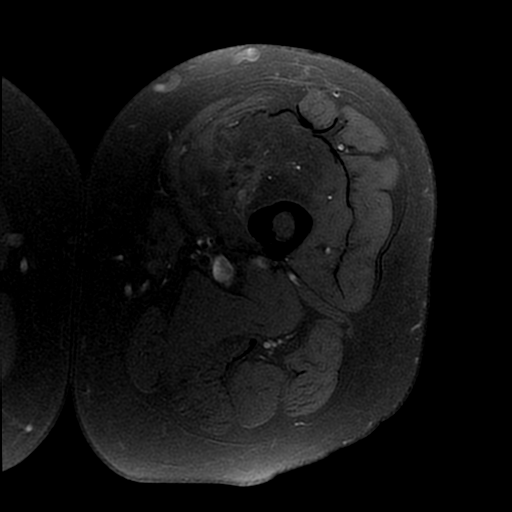
[im 60/60]
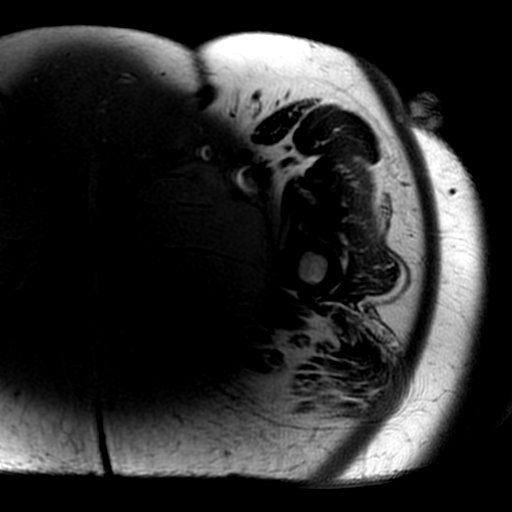

[Series 9: T2 fat-sat · sagittal · 4.0mm · 0.66mm/px · 4 of 44 slices shown (2 of 2)]
[im 1/44]
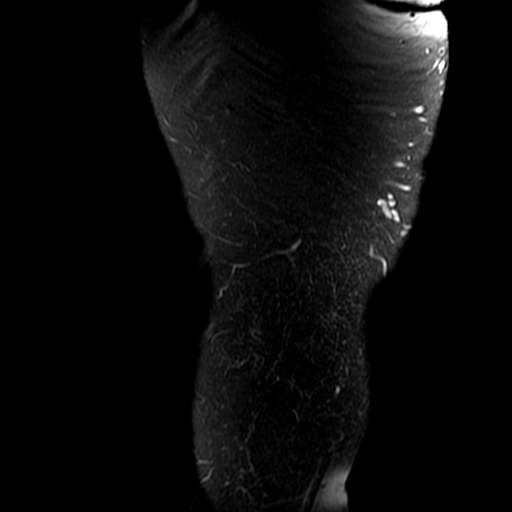
[im 15/44]
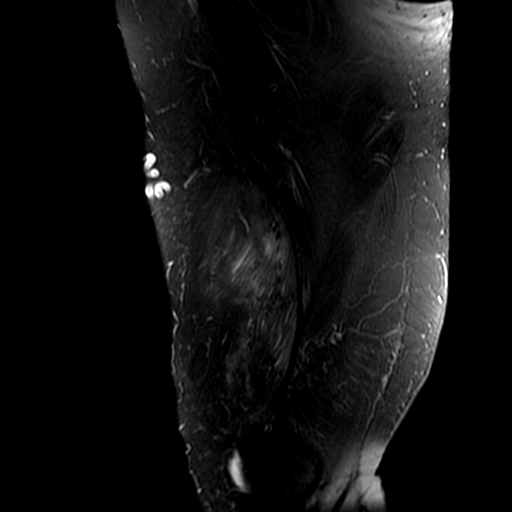
[im 29/44]
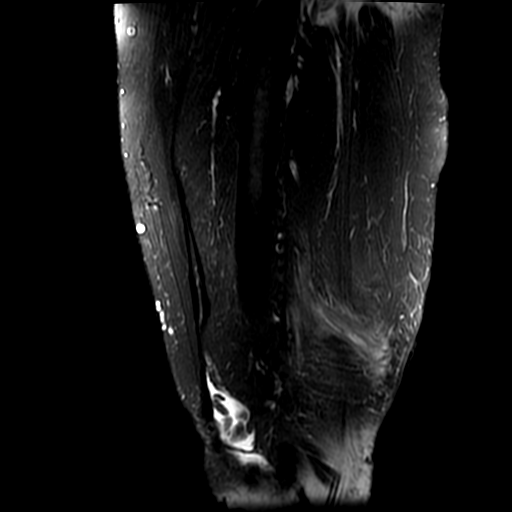
[im 44/44]
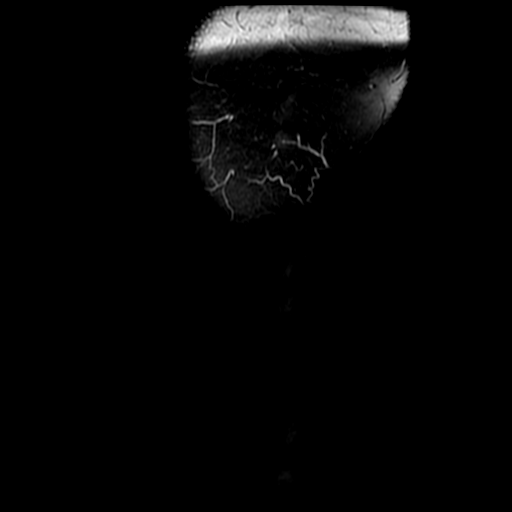

[17 of 40 positions shown; findings below may reference images not displayed]

FINDINGS: At the site of the prior large cavity which was reportedly an
abscess, there is a tubular thick-walled complex structure with
enhancing margins but without a significant intraluminal component
tracking from the anterior periosteal margin of the proximal femoral
shaft along the vastus medialis muscle and vastus intermedius
muscle, tracking around medial to the mid to distal femoral shaft to
extending between the femoral neurovascular structures and the short
head of the biceps femoris. The band of abnormal enhancement thin
tracks along the medial margin of the biceps femora is to the
posterior skin surface on image 44 series 11.

Along this abnormal and indistinctly marginated band of enhancement,
there is fatty atrophy of the vastus medialis and vastus intermedius
muscles as well as fatty atrophy of the semimembranosus. The band
tracks just medial to the sciatic nerve on image 42 series 5, just
above the split off of the common peroneal nerve. Along its length,
the enhancing band has indistinct spiculated margin.

At the upper portion of this band, there is abnormal cortical
thickening and periostitis involving the femur as shown on images 1
through 19 of series 5. This is similar to prior and concerning for
osteomyelitis given the purulent nature of the drained collection.
The only unusual factor is the low degree of enhancement but this
could represent a chronic infection of the cortex.

Anterior and medial venous varicosities in the subcutaneous tissues.
Aside from the above described findings, I do not observe a mass
lesion posteriorly in the thigh.

Small knee effusion.
IMPRESSION: 1. Corresponding to the previous large sprain cystic collection,
there is a band of ill-defined enhancement tracking from a region of
abnormal cortical thickening in the femoral diaphysis, throughout
along the vastus intermedius and medialis musculature, tracking back
between the femur and femoral vasculature to track along the medial
margin of the biceps femoris, just lateral to the distal quadriceps
tendon. Although the abscess has been drained and there is not a
significant residual volume of recurrent abscess to drain, there is
still a vertically oriented tubular thickening along the original
abscess site, and the possibility of chronic osteomyelitis involving
the femur is certainly raised as a possibility. I do not see a
separate mass. The likelihood of there being malignancy tracking
along this process seems remote if the original contents were
appearing ala. There is fatty atrophy of the involve musculature
most notably the vastus intermedius and medialis. I do note that
this lesion has been present for several years, and was previously
hypermetabolic back in 6576. At this point this lesion should of
been biopsied and drained and its nature has an infectious versus
neoplastic lesion is hopefully well establish clinically. Current
appearance favors chronic infection, possibly reseeding in the area
of suspected chronic osteomyelitis.
2. Small knee effusion
3. Subcutaneous venous varicosities.

## 2017-07-20 DIAGNOSIS — E1165 Type 2 diabetes mellitus with hyperglycemia: Secondary | ICD-10-CM | POA: Diagnosis not present

## 2017-07-20 DIAGNOSIS — E7800 Pure hypercholesterolemia, unspecified: Secondary | ICD-10-CM | POA: Diagnosis not present

## 2017-07-20 DIAGNOSIS — Z789 Other specified health status: Secondary | ICD-10-CM | POA: Diagnosis not present

## 2017-07-20 DIAGNOSIS — Z299 Encounter for prophylactic measures, unspecified: Secondary | ICD-10-CM | POA: Diagnosis not present

## 2017-07-20 DIAGNOSIS — C189 Malignant neoplasm of colon, unspecified: Secondary | ICD-10-CM | POA: Diagnosis not present

## 2017-07-20 DIAGNOSIS — Z6838 Body mass index (BMI) 38.0-38.9, adult: Secondary | ICD-10-CM | POA: Diagnosis not present

## 2017-07-20 DIAGNOSIS — R350 Frequency of micturition: Secondary | ICD-10-CM | POA: Diagnosis not present

## 2017-08-09 DIAGNOSIS — E1165 Type 2 diabetes mellitus with hyperglycemia: Secondary | ICD-10-CM | POA: Diagnosis not present

## 2017-08-09 DIAGNOSIS — Z6839 Body mass index (BMI) 39.0-39.9, adult: Secondary | ICD-10-CM | POA: Diagnosis not present

## 2017-08-09 DIAGNOSIS — Z789 Other specified health status: Secondary | ICD-10-CM | POA: Diagnosis not present

## 2017-08-09 DIAGNOSIS — Z299 Encounter for prophylactic measures, unspecified: Secondary | ICD-10-CM | POA: Diagnosis not present

## 2017-08-09 DIAGNOSIS — C189 Malignant neoplasm of colon, unspecified: Secondary | ICD-10-CM | POA: Diagnosis not present

## 2017-10-25 DIAGNOSIS — C189 Malignant neoplasm of colon, unspecified: Secondary | ICD-10-CM | POA: Diagnosis not present

## 2017-10-25 DIAGNOSIS — Z299 Encounter for prophylactic measures, unspecified: Secondary | ICD-10-CM | POA: Diagnosis not present

## 2017-10-25 DIAGNOSIS — Z6841 Body Mass Index (BMI) 40.0 and over, adult: Secondary | ICD-10-CM | POA: Diagnosis not present

## 2017-10-25 DIAGNOSIS — Z6839 Body mass index (BMI) 39.0-39.9, adult: Secondary | ICD-10-CM | POA: Diagnosis not present

## 2017-10-25 DIAGNOSIS — E1165 Type 2 diabetes mellitus with hyperglycemia: Secondary | ICD-10-CM | POA: Diagnosis not present

## 2017-11-26 DIAGNOSIS — E119 Type 2 diabetes mellitus without complications: Secondary | ICD-10-CM | POA: Diagnosis not present

## 2017-11-26 DIAGNOSIS — E7800 Pure hypercholesterolemia, unspecified: Secondary | ICD-10-CM | POA: Diagnosis not present

## 2017-11-29 DIAGNOSIS — Z299 Encounter for prophylactic measures, unspecified: Secondary | ICD-10-CM | POA: Diagnosis not present

## 2017-11-29 DIAGNOSIS — Z6841 Body Mass Index (BMI) 40.0 and over, adult: Secondary | ICD-10-CM | POA: Diagnosis not present

## 2017-11-29 DIAGNOSIS — M549 Dorsalgia, unspecified: Secondary | ICD-10-CM | POA: Diagnosis not present

## 2017-11-29 DIAGNOSIS — E1165 Type 2 diabetes mellitus with hyperglycemia: Secondary | ICD-10-CM | POA: Diagnosis not present

## 2017-11-29 DIAGNOSIS — Z6839 Body mass index (BMI) 39.0-39.9, adult: Secondary | ICD-10-CM | POA: Diagnosis not present

## 2018-04-25 DIAGNOSIS — Z1339 Encounter for screening examination for other mental health and behavioral disorders: Secondary | ICD-10-CM | POA: Diagnosis not present

## 2018-04-25 DIAGNOSIS — Z6838 Body mass index (BMI) 38.0-38.9, adult: Secondary | ICD-10-CM | POA: Diagnosis not present

## 2018-04-25 DIAGNOSIS — Z79899 Other long term (current) drug therapy: Secondary | ICD-10-CM | POA: Diagnosis not present

## 2018-04-25 DIAGNOSIS — Z0000 Encounter for general adult medical examination without abnormal findings: Secondary | ICD-10-CM | POA: Diagnosis not present

## 2018-04-25 DIAGNOSIS — E559 Vitamin D deficiency, unspecified: Secondary | ICD-10-CM | POA: Diagnosis not present

## 2018-04-25 DIAGNOSIS — E1165 Type 2 diabetes mellitus with hyperglycemia: Secondary | ICD-10-CM | POA: Diagnosis not present

## 2018-04-25 DIAGNOSIS — Z299 Encounter for prophylactic measures, unspecified: Secondary | ICD-10-CM | POA: Diagnosis not present

## 2018-04-25 DIAGNOSIS — R350 Frequency of micturition: Secondary | ICD-10-CM | POA: Diagnosis not present

## 2018-04-25 DIAGNOSIS — Z1331 Encounter for screening for depression: Secondary | ICD-10-CM | POA: Diagnosis not present

## 2018-04-25 DIAGNOSIS — Z7189 Other specified counseling: Secondary | ICD-10-CM | POA: Diagnosis not present

## 2018-04-25 DIAGNOSIS — R5383 Other fatigue: Secondary | ICD-10-CM | POA: Diagnosis not present

## 2018-04-25 DIAGNOSIS — Z1211 Encounter for screening for malignant neoplasm of colon: Secondary | ICD-10-CM | POA: Diagnosis not present

## 2018-04-25 DIAGNOSIS — E7800 Pure hypercholesterolemia, unspecified: Secondary | ICD-10-CM | POA: Diagnosis not present

## 2018-05-09 DIAGNOSIS — Z6838 Body mass index (BMI) 38.0-38.9, adult: Secondary | ICD-10-CM | POA: Diagnosis not present

## 2018-05-09 DIAGNOSIS — C189 Malignant neoplasm of colon, unspecified: Secondary | ICD-10-CM | POA: Diagnosis not present

## 2018-05-09 DIAGNOSIS — E1165 Type 2 diabetes mellitus with hyperglycemia: Secondary | ICD-10-CM | POA: Diagnosis not present

## 2018-05-09 DIAGNOSIS — Z6841 Body Mass Index (BMI) 40.0 and over, adult: Secondary | ICD-10-CM | POA: Diagnosis not present

## 2018-05-09 DIAGNOSIS — Z299 Encounter for prophylactic measures, unspecified: Secondary | ICD-10-CM | POA: Diagnosis not present

## 2018-05-21 DIAGNOSIS — Z1231 Encounter for screening mammogram for malignant neoplasm of breast: Secondary | ICD-10-CM | POA: Diagnosis not present

## 2018-05-27 DIAGNOSIS — E7800 Pure hypercholesterolemia, unspecified: Secondary | ICD-10-CM | POA: Diagnosis not present

## 2018-05-27 DIAGNOSIS — E119 Type 2 diabetes mellitus without complications: Secondary | ICD-10-CM | POA: Diagnosis not present

## 2018-06-13 DIAGNOSIS — E2839 Other primary ovarian failure: Secondary | ICD-10-CM | POA: Diagnosis not present

## 2018-10-17 DIAGNOSIS — Z20828 Contact with and (suspected) exposure to other viral communicable diseases: Secondary | ICD-10-CM | POA: Diagnosis not present

## 2019-01-02 DIAGNOSIS — E119 Type 2 diabetes mellitus without complications: Secondary | ICD-10-CM | POA: Diagnosis not present

## 2019-01-02 DIAGNOSIS — E7800 Pure hypercholesterolemia, unspecified: Secondary | ICD-10-CM | POA: Diagnosis not present

## 2019-02-24 DIAGNOSIS — U071 COVID-19: Secondary | ICD-10-CM | POA: Diagnosis not present

## 2019-02-24 DIAGNOSIS — Z20828 Contact with and (suspected) exposure to other viral communicable diseases: Secondary | ICD-10-CM | POA: Diagnosis not present

## 2019-03-20 DIAGNOSIS — Z20828 Contact with and (suspected) exposure to other viral communicable diseases: Secondary | ICD-10-CM | POA: Diagnosis not present

## 2019-03-24 DIAGNOSIS — Z299 Encounter for prophylactic measures, unspecified: Secondary | ICD-10-CM | POA: Diagnosis not present

## 2019-03-24 DIAGNOSIS — N390 Urinary tract infection, site not specified: Secondary | ICD-10-CM | POA: Diagnosis not present

## 2019-03-24 DIAGNOSIS — E1165 Type 2 diabetes mellitus with hyperglycemia: Secondary | ICD-10-CM | POA: Diagnosis not present

## 2019-03-24 DIAGNOSIS — Z6838 Body mass index (BMI) 38.0-38.9, adult: Secondary | ICD-10-CM | POA: Diagnosis not present

## 2019-03-24 DIAGNOSIS — R350 Frequency of micturition: Secondary | ICD-10-CM | POA: Diagnosis not present

## 2019-05-01 LAB — BASIC METABOLIC PANEL
BUN: 13 (ref 4–21)
Creatinine: 0.7 (ref <=1.1)

## 2019-05-09 LAB — LIPID PANEL
Cholesterol: 299 — AB (ref 0–200)
HDL: 41 (ref 35–70)
LDL Cholesterol: 211
Triglycerides: 237 — AB (ref 40–160)

## 2019-05-09 LAB — VITAMIN D 25 HYDROXY (VIT D DEFICIENCY, FRACTURES): Vit D, 25-Hydroxy: 11.7

## 2019-05-09 LAB — TSH: TSH: 1.09 (ref <=5.90)

## 2019-05-13 DIAGNOSIS — E119 Type 2 diabetes mellitus without complications: Secondary | ICD-10-CM | POA: Diagnosis not present

## 2019-05-13 DIAGNOSIS — E7800 Pure hypercholesterolemia, unspecified: Secondary | ICD-10-CM | POA: Diagnosis not present

## 2019-05-15 DIAGNOSIS — Z20828 Contact with and (suspected) exposure to other viral communicable diseases: Secondary | ICD-10-CM | POA: Diagnosis not present

## 2019-05-22 DIAGNOSIS — Z20828 Contact with and (suspected) exposure to other viral communicable diseases: Secondary | ICD-10-CM | POA: Diagnosis not present

## 2019-05-26 DIAGNOSIS — Z23 Encounter for immunization: Secondary | ICD-10-CM | POA: Diagnosis not present

## 2019-05-29 DIAGNOSIS — Z20828 Contact with and (suspected) exposure to other viral communicable diseases: Secondary | ICD-10-CM | POA: Diagnosis not present

## 2019-06-04 DIAGNOSIS — Z20828 Contact with and (suspected) exposure to other viral communicable diseases: Secondary | ICD-10-CM | POA: Diagnosis not present

## 2019-06-05 DIAGNOSIS — Z299 Encounter for prophylactic measures, unspecified: Secondary | ICD-10-CM | POA: Diagnosis not present

## 2019-06-05 DIAGNOSIS — E7800 Pure hypercholesterolemia, unspecified: Secondary | ICD-10-CM | POA: Diagnosis not present

## 2019-06-05 DIAGNOSIS — Z6838 Body mass index (BMI) 38.0-38.9, adult: Secondary | ICD-10-CM | POA: Diagnosis not present

## 2019-06-05 DIAGNOSIS — E1165 Type 2 diabetes mellitus with hyperglycemia: Secondary | ICD-10-CM | POA: Diagnosis not present

## 2019-06-05 DIAGNOSIS — Z789 Other specified health status: Secondary | ICD-10-CM | POA: Diagnosis not present

## 2019-06-05 DIAGNOSIS — Z6841 Body Mass Index (BMI) 40.0 and over, adult: Secondary | ICD-10-CM | POA: Diagnosis not present

## 2019-06-11 DIAGNOSIS — E119 Type 2 diabetes mellitus without complications: Secondary | ICD-10-CM | POA: Diagnosis not present

## 2019-06-15 DIAGNOSIS — Z23 Encounter for immunization: Secondary | ICD-10-CM | POA: Diagnosis not present

## 2019-06-17 DIAGNOSIS — Z20828 Contact with and (suspected) exposure to other viral communicable diseases: Secondary | ICD-10-CM | POA: Diagnosis not present

## 2019-06-30 DIAGNOSIS — Z6839 Body mass index (BMI) 39.0-39.9, adult: Secondary | ICD-10-CM | POA: Diagnosis not present

## 2019-06-30 DIAGNOSIS — E1165 Type 2 diabetes mellitus with hyperglycemia: Secondary | ICD-10-CM | POA: Diagnosis not present

## 2019-06-30 DIAGNOSIS — R350 Frequency of micturition: Secondary | ICD-10-CM | POA: Diagnosis not present

## 2019-06-30 DIAGNOSIS — Z299 Encounter for prophylactic measures, unspecified: Secondary | ICD-10-CM | POA: Diagnosis not present

## 2019-07-13 DIAGNOSIS — E119 Type 2 diabetes mellitus without complications: Secondary | ICD-10-CM | POA: Diagnosis not present

## 2019-07-24 DIAGNOSIS — Z20828 Contact with and (suspected) exposure to other viral communicable diseases: Secondary | ICD-10-CM | POA: Diagnosis not present

## 2019-07-30 DIAGNOSIS — E119 Type 2 diabetes mellitus without complications: Secondary | ICD-10-CM | POA: Diagnosis not present

## 2019-07-30 DIAGNOSIS — Z6838 Body mass index (BMI) 38.0-38.9, adult: Secondary | ICD-10-CM | POA: Diagnosis not present

## 2019-07-30 DIAGNOSIS — M549 Dorsalgia, unspecified: Secondary | ICD-10-CM | POA: Diagnosis not present

## 2019-07-30 DIAGNOSIS — E1165 Type 2 diabetes mellitus with hyperglycemia: Secondary | ICD-10-CM | POA: Diagnosis not present

## 2019-07-30 DIAGNOSIS — Z299 Encounter for prophylactic measures, unspecified: Secondary | ICD-10-CM | POA: Diagnosis not present

## 2019-07-30 DIAGNOSIS — Z789 Other specified health status: Secondary | ICD-10-CM | POA: Diagnosis not present

## 2019-08-12 DIAGNOSIS — E119 Type 2 diabetes mellitus without complications: Secondary | ICD-10-CM | POA: Diagnosis not present

## 2019-08-18 DIAGNOSIS — Z20828 Contact with and (suspected) exposure to other viral communicable diseases: Secondary | ICD-10-CM | POA: Diagnosis not present

## 2019-09-12 DIAGNOSIS — E119 Type 2 diabetes mellitus without complications: Secondary | ICD-10-CM | POA: Diagnosis not present

## 2019-10-06 DIAGNOSIS — E1165 Type 2 diabetes mellitus with hyperglycemia: Secondary | ICD-10-CM | POA: Diagnosis not present

## 2019-10-06 DIAGNOSIS — Z6838 Body mass index (BMI) 38.0-38.9, adult: Secondary | ICD-10-CM | POA: Diagnosis not present

## 2019-10-06 DIAGNOSIS — Z299 Encounter for prophylactic measures, unspecified: Secondary | ICD-10-CM | POA: Diagnosis not present

## 2019-10-06 DIAGNOSIS — Z789 Other specified health status: Secondary | ICD-10-CM | POA: Diagnosis not present

## 2019-10-06 DIAGNOSIS — M1710 Unilateral primary osteoarthritis, unspecified knee: Secondary | ICD-10-CM | POA: Diagnosis not present

## 2019-10-06 LAB — HEMOGLOBIN A1C: Hemoglobin A1C: 11.7

## 2019-10-12 DIAGNOSIS — E7800 Pure hypercholesterolemia, unspecified: Secondary | ICD-10-CM | POA: Diagnosis not present

## 2019-10-12 DIAGNOSIS — E119 Type 2 diabetes mellitus without complications: Secondary | ICD-10-CM | POA: Diagnosis not present

## 2019-11-12 DIAGNOSIS — E119 Type 2 diabetes mellitus without complications: Secondary | ICD-10-CM | POA: Diagnosis not present

## 2019-11-12 DIAGNOSIS — E7800 Pure hypercholesterolemia, unspecified: Secondary | ICD-10-CM | POA: Diagnosis not present

## 2019-11-25 DIAGNOSIS — M79604 Pain in right leg: Secondary | ICD-10-CM | POA: Diagnosis not present

## 2019-11-25 DIAGNOSIS — E1143 Type 2 diabetes mellitus with diabetic autonomic (poly)neuropathy: Secondary | ICD-10-CM | POA: Diagnosis not present

## 2019-11-25 DIAGNOSIS — M79605 Pain in left leg: Secondary | ICD-10-CM | POA: Diagnosis not present

## 2019-12-01 ENCOUNTER — Encounter: Payer: Self-pay | Admitting: "Endocrinology

## 2019-12-01 ENCOUNTER — Encounter: Payer: Medicare Other | Attending: Internal Medicine | Admitting: Nutrition

## 2019-12-01 ENCOUNTER — Other Ambulatory Visit: Payer: Self-pay

## 2019-12-01 ENCOUNTER — Encounter: Payer: Self-pay | Admitting: Nutrition

## 2019-12-01 ENCOUNTER — Ambulatory Visit (INDEPENDENT_AMBULATORY_CARE_PROVIDER_SITE_OTHER): Payer: Medicare Other | Admitting: "Endocrinology

## 2019-12-01 VITALS — BP 128/80 | HR 85 | Ht 71.0 in | Wt 263.4 lb

## 2019-12-01 DIAGNOSIS — E1159 Type 2 diabetes mellitus with other circulatory complications: Secondary | ICD-10-CM | POA: Diagnosis not present

## 2019-12-01 DIAGNOSIS — E782 Mixed hyperlipidemia: Secondary | ICD-10-CM | POA: Insufficient documentation

## 2019-12-01 DIAGNOSIS — E559 Vitamin D deficiency, unspecified: Secondary | ICD-10-CM

## 2019-12-01 DIAGNOSIS — I10 Essential (primary) hypertension: Secondary | ICD-10-CM | POA: Insufficient documentation

## 2019-12-01 MED ORDER — VITAMIN D (ERGOCALCIFEROL) 1.25 MG (50000 UNIT) PO CAPS: 50000.0000 [IU] | ORAL_CAPSULE | ORAL | 0 refills | Status: DC

## 2019-12-01 NOTE — Progress Notes (Signed)
  Medical Nutrition Therapy:  Appt start time: 1216 end time:  1600.   Assessment:  Primary concerns today: DM Type 2 . Walk in per Dr. Dorris Fetch. Lives by herself. Eats 2 meals per day  Will do safety questions next visit.  Lab Results  Component Value Date   HGBA1C 11.7 10/06/2019    Preferred Learning Style:   Auditory  Visual  Hands on     Learning Readiness:    Ready  Change in progress   MEDICATIONS:   DIETARY INTAKE:  Eats 2-3 meals per day   Usual physical activity: ADL  Estimated energy needs: 1200 calories 135  g carbohydrates 90 g protein 33 g fat  Progress Towards Goal(s):  In progress.   Nutritional Diagnosis:  NB-1.1 Food and nutrition-related knowledge deficit As related to Diabetes.  As evidenced by A1C 11.4%..    Intervention:  Nutrition and Diabetes education provided on My Plate, CHO counting, meal planning, portion sizes, timing of meals, avoiding snacks between meals unless having a low blood sugar, target ranges for A1C and blood sugars, signs/symptoms and treatment of hyper/hypoglycemia, monitoring blood sugars, taking medications as prescribed, benefits of exercising 30 minutes per day and prevention of complications of DM. Marland Kitchen Goals  Follow My Plate Cut out sweet tea Drink black coffee Drink only water  Eat three meals per day  Avoid snacks between meals Test blood sugars 4 times per day Walk 30 minutes 3 times per week. Get A1C down to 7%   Teaching Method Utilized: Visual Auditory Hands on  Handouts given during visit include:  The Plate Method  Diabetes Instructions.  Barriers to learning/adherence to lifestyle change: none  Demonstrated degree of understanding via:  Teach Back   Monitoring/Evaluation:  Dietary intake, exercise, , and body weight in 1 month(s).

## 2019-12-01 NOTE — Progress Notes (Signed)
Endocrinology Consult Note       12/01/2019, 3:06 PM   Subjective:    Patient ID: Laurie Palmer, female    DOB: 02-May-1946.  Laurie Palmer is being seen in consultation for management of currently uncontrolled symptomatic diabetes requested by  Monico Blitz, MD.   Past Medical History:  Diagnosis Date  . Colon cancer ([place])   . Elevated carcinoembryonic antigen (CEA)   . Hypercalcemia   . Mass of left thigh     Past Surgical History:  Procedure Laterality Date  . IRRIGATION AND DEBRIDEMENT ABSCESS Left 03/25/2015   Procedure: IRRIGATION AND DEBRIDEMENT ABSCESS LEFT THIGH WITH BIOPSIES AND WOUND VAC PLACEMENT;  Surgeon: Hall Busing, MD;  Location: [facility];  Service: General;  Laterality: Left;    Social History   Socioeconomic History  . Marital status: Widowed    Spouse name: Not on file  . Number of children: Not on file  . Years of education: Not on file  . Highest education level: Not on file  Occupational History  . Not on file  Tobacco Use  . Smoking status: Never Smoker  . Smokeless tobacco: Never Used  Substance and Sexual Activity  . Alcohol use: No    Alcohol/week: 0.0 standard drinks  . Drug use: No  . Sexual activity: Not on file  Other Topics Concern  . Not on file  Social History Narrative  . Not on file   Social Determinants of Health   Financial Resource Strain:   . Difficulty of Paying Living Expenses:   Food Insecurity:   . Worried About Charity fundraiser in the Last Year:   . Arboriculturist in the Last Year:   Transportation Needs:   . Film/video editor (Medical):   Marland Kitchen Lack of Transportation (Non-Medical):   Physical Activity:   . Days of Exercise per Week:   . Minutes of Exercise per Session:   Stress:   . Feeling of Stress :   Social Connections:   . Frequency of Communication with Friends and Family:   . Frequency of Social Gatherings  with Friends and Family:   . Attends Religious Services:   . Active Member of Clubs or Organizations:   . Attends Archivist Meetings:   Marland Kitchen Marital Status:     Family History  Problem Relation Age of Onset  . Diabetes Mother   . Hypertension Mother   . Hyperlipidemia Mother   . Heart attack Mother   . Heart failure Mother   . Prostate cancer Father   . Prostate cancer Brother     Outpatient Encounter Medications as of 12/01/2019  Medication Sig  . CINNAMON PO Take 1 tablet by mouth daily.  Marland Kitchen liraglutide (VICTOZA) 18 MG/3ML SOPN Inject 1.8 mg into the skin daily.  . metFORMIN (GLUCOPHAGE) 1000 MG tablet Take 1,000 mg by mouth 2 (two) times daily with a meal.  . pravastatin (PRAVACHOL) 20 MG tablet Take 20 mg by mouth daily.  Marland Kitchen aspirin (ASPIRIN EC) 81 MG EC tablet Take 81 mg by mouth daily.   . diclofenac (VOLTAREN) 75 MG  EC tablet Take 75 mg by mouth 2 (two) times daily.  . insulin aspart (NOVOLOG) 100 UNIT/ML injection Inject 10-16 Units into the skin 3 (three) times daily before meals.  . insulin detemir (LEVEMIR) 100 UNIT/ML injection Inject 60 Units into the skin at bedtime.  . Vitamin D, Ergocalciferol, (DRISDOL) 1.25 MG (50000 UNIT) CAPS capsule Take 1 capsule (50,000 Units total) by mouth every 7 (seven) days.  . [DISCONTINUED] Cholecalciferol (VITAMIN D3) 1000 UNITS CHEW Chew 1 each by mouth daily.   . [DISCONTINUED] HYDROcodone-acetaminophen (NORCO/VICODIN) 5-325 MG tablet   . [DISCONTINUED] INVOKAMET XR [phone] MG TB24    No facility-administered encounter medications on file as of 12/01/2019.    ALLERGIES: Allergies  Allergen Reactions  . Shellfish Allergy Swelling and Anaphylaxis  . Lipitor [Atorvastatin] Nausea Only    VACCINATION STATUS: There is no immunization history for the selected administration types on file for this patient.  Diabetes She presents for her initial diabetic visit. She has type 2 diabetes mellitus. Onset time: She was diagnosed  at approximate age of 65 years. Her disease course has been worsening. There are no hypoglycemic associated symptoms. Pertinent negatives for hypoglycemia include no confusion, headaches, pallor or seizures. Associated symptoms include fatigue, polydipsia and polyuria. Pertinent negatives for diabetes include no chest pain and no polyphagia. There are no hypoglycemic complications. Symptoms are worsening. There are no diabetic complications. Risk factors for coronary artery disease include dyslipidemia, diabetes mellitus, obesity, sedentary lifestyle and post-menopausal. Current diabetic treatments: She is currently on Levemir 60-65 units nightly, NovoLog 10-16 units 3 times daily, Victoza 1.2 mg daily, Metformin 100 mg p.o. twice daily. Her weight is fluctuating minimally. She is following a generally unhealthy diet. When asked about meal planning, she reported none. She has not had a previous visit with a dietitian. She rarely participates in exercise. Her home blood glucose trend is increasing steadily. Her overall blood glucose range is >200 mg/dl. (She did not bring any logs nor meter to review today.  Her recent A1c was 11.7% in May 2021, no hypoglycemia reported or documented.)  Hyperlipidemia This is a chronic problem. The current episode started more than 1 year ago. The problem is uncontrolled. Exacerbating diseases include diabetes and obesity. Pertinent negatives include no chest pain, myalgias or shortness of breath. Current antihyperlipidemic treatment includes statins. Risk factors for coronary artery disease include dyslipidemia, diabetes mellitus, family history, obesity, hypertension, a sedentary lifestyle and post-menopausal.     Review of Systems  Constitutional: Positive for fatigue. Negative for chills, fever and unexpected weight change.  HENT: Negative for trouble swallowing and voice change.   Eyes: Negative for visual disturbance.  Respiratory: Negative for cough, shortness of  breath and wheezing.   Cardiovascular: Negative for chest pain, palpitations and leg swelling.  Gastrointestinal: Negative for diarrhea, nausea and vomiting.  Endocrine: Positive for polydipsia and polyuria. Negative for cold intolerance, heat intolerance and polyphagia.  Musculoskeletal: Negative for arthralgias and myalgias.  Skin: Negative for color change, pallor, rash and wound.  Neurological: Negative for seizures and headaches.  Psychiatric/Behavioral: Negative for confusion and suicidal ideas.    Objective:    Vitals with BMI 12/01/2019 01/25/2016 07/13/2015  Height 5\' 11"  - -  Weight 263 lbs 6 oz 271 lbs 9 oz 275 lbs 6 oz  BMI 25.05 - -  Systolic 397 673 419  Diastolic 80 71 73  Pulse 85 86 101    BP 128/80   Pulse 85   Ht 5\' 11"  (1.803 m)  Wt 263 lb 6.4 oz (119.5 kg)   BMI 36.74 kg/m   Wt Readings from Last 3 Encounters:  12/01/19 263 lb 6.4 oz (119.5 kg)  01/25/16 271 lb 9 oz (123.2 kg)  07/13/15 275 lb 6 oz (124.9 kg)     Physical Exam Constitutional:      Appearance: She is well-developed.  HENT:     Head: Normocephalic and atraumatic.  Neck:     Thyroid: No thyromegaly.     Trachea: No tracheal deviation.  Cardiovascular:     Rate and Rhythm: Normal rate and regular rhythm.  Pulmonary:     Effort: Pulmonary effort is normal.  Abdominal:     Tenderness: There is no abdominal tenderness. There is no guarding.  Musculoskeletal:        General: Normal range of motion.     Cervical back: Normal range of motion and neck supple.     Comments: Normal foot exam today.  Skin:    General: Skin is warm and dry.     Coloration: Skin is not pale.     Findings: No erythema or rash.  Neurological:     Mental Status: She is alert and oriented to person, place, and time.     Cranial Nerves: No cranial nerve deficit.     Coordination: Coordination normal.     Deep Tendon Reflexes: Reflexes are normal and symmetric.  Psychiatric:        Judgment: Judgment normal.        CMP ( most recent) CMP     Component Value Date/Time   NA 138 03/26/2015 0540   K 4.3 03/26/2015 0540   CL 104 03/26/2015 0540   CO2 29 03/26/2015 0540   GLUCOSE 135 (H) 03/26/2015 0540   BUN 13 05/01/2019 0000   CREATININE 0.7 05/01/2019 0000   CREATININE 0.60 01/18/2016 1413   CALCIUM 10.1 03/26/2015 0540   PROT 8.4 (H) 03/23/2015 2314   ALBUMIN 3.6 03/23/2015 2314   AST 18 03/23/2015 2314   ALT 18 03/23/2015 2314   ALKPHOS 135 (H) 03/23/2015 2314   BILITOT 0.5 03/23/2015 2314   GFRNONAA >60 03/26/2015 0540   GFRAA >60 03/26/2015 0540     Diabetic Labs (most recent): Lab Results  Component Value Date   HGBA1C 11.7 10/06/2019    CMP Latest Ref Rng & Units 05/01/2019 01/18/2016 06/28/2015  Glucose 65 - 99 mg/dL - - -  BUN 4 - 21 13 - -  Creatinine 0.5 - 1.1 0.7 0.60 0.60  Sodium 135 - 145 mmol/L - - -  Potassium 3.5 - 5.1 mmol/L - - -  Chloride 101 - 111 mmol/L - - -  CO2 22 - 32 mmol/L - - -  Calcium 8.9 - 10.3 mg/dL - - -  Total Protein 6.5 - 8.1 g/dL - - -  Total Bilirubin 0.3 - 1.2 mg/dL - - -  Alkaline Phos 38 - 126 U/L - - -  AST 15 - 41 U/L - - -  ALT 14 - 54 U/L - - -   Lipid Panel     Component Value Date/Time   CHOL 299 (A) 05/09/2019 0000   TRIG 237 (A) 05/09/2019 0000   HDL 41 05/09/2019 0000   LDLCALC 211 05/09/2019 0000      Assessment & Plan:   1. DM type 2 causing vascular disease ([place])   - Laurie Palmer has currently uncontrolled symptomatic type 2 DM since  74 years of age,  with most recent A1c of 11.7 %. Recent labs reviewed. - I had a long discussion with her about the progressive nature of diabetes and the pathology behind its complications. -her diabetes is complicated by obesity/sedentary life and she remains at a high risk for more acute and chronic complications which include CAD, CVA, CKD, retinopathy, and neuropathy. These are all discussed in detail with her.  - I have counseled her on diet  and weight  management  by adopting a carbohydrate restricted/protein rich diet. Patient is encouraged to switch to  unprocessed or minimally processed     complex starch and increased protein intake (animal or plant source), fruits, and vegetables. -  she is advised to stick to a routine mealtimes to eat 3 meals  a day and avoid unnecessary snacks ( to snack only to correct hypoglycemia).   - she admits that there is a room for improvement in her food and drink choices. - Suggestion is made for her to avoid simple carbohydrates  from her diet including Cakes, Sweet Desserts, Ice Cream, Soda (diet and regular), Sweet Tea, Candies, Chips, Cookies, Store Bought Juices, Alcohol in Excess of  1-2 drinks a day, Artificial Sweeteners,  Coffee Creamer, and "Sugar-free" Products. This will help patient to have more stable blood glucose profile and potentially avoid unintended weight gain.  - she will be scheduled with Jearld Fenton, RDN, CDE for diabetes education.  - I have approached her with the following individualized plan to manage  her diabetes and patient agrees:   - she will continue to need intensive treatment with basal/bolus insulin in order for her to achieve and maintain control of diabetes and GERD. -Accordingly, she is advised to continue Levemir  60 units daily at bedtime , and prandial insulin NovoLog 10 Units 3 times a day with meals  for pre-meal BG readings of 90-150mg /dl, plus patient specific correction dose for unexpected hyperglycemia above 150mg /dl, associated with strict monitoring of glucose 4 times a day-before meals and at bedtime. - she is warned not to take insulin without proper monitoring per orders. - Adjustment parameters are given to her for hypo and hyperglycemia in writing. - she is encouraged to call clinic for blood glucose levels less than 70 or above 300 mg /dl. - she is advised to continue Metformin 1000 mg p.o. twice daily and Victoza increased to 1.8 mg subcutaneously daily,  therapeutically suitable for patient .  - Specific targets for  A1c;  LDL, HDL,  and Triglycerides were discussed with the patient.  2) Blood Pressure /Hypertension:  her blood pressure is  controlled to target.   she is advised to bring all of her medications, currently on not on ACE inhibitor/ARB.    3) Lipids/Hyperlipidemia:   Review of her recent lipid panel showed uncontrolled  LDL at 211 .  she  is advised to resume and continue Pravachol 20 mg p.o. daily at bedtime.    Side effects and precautions discussed with her.  4)  Weight/Diet:  Body mass index is 36.74 kg/m.  -   clearly complicating her diabetes care.   she is  a candidate for weight loss. I discussed with her the fact that loss of 5 - 10% of her  current body weight will have the most impact on her diabetes management.  Exercise, and detailed carbohydrates information provided  -  detailed on discharge instructions.  5) vitamin D deficiency: Recently 11.7 She is advised to resume her ergocalciferol 50,000 units weekly for 12  weeks.  6) Chronic Care/Health Maintenance:  -she  Is non Statin medications and  is encouraged to initiate and continue to follow up with Ophthalmology, Dentist,  Podiatrist at least yearly or according to recommendations, and advised to   stay away from smoking. I have recommended yearly flu vaccine and pneumonia vaccine at least every 5 years; moderate intensity exercise for up to 150 minutes weekly; and  sleep for at least 7 hours a day.  - she is  advised to maintain close follow up with Monico Blitz, MD for primary care needs, as well as her other providers for optimal and coordinated care.   - Time spent in this patient care: 60 min, of which > 50% was spent in  counseling  her about her currently uncontrolled type 2 diabetes, hyperlipidemia, vitamin D deficiency, obesity/sedentary life and the rest reviewing her blood glucose logs , discussing her hypoglycemia and hyperglycemia episodes, reviewing  her current and  previous labs / studies  ( including abstraction from other facilities) and medications  doses and developing a  long term treatment plan based on the latest standards of care/ guidelines; and documenting her care.    Please refer to Patient Instructions for Blood Glucose Monitoring and Insulin/Medications Dosing Guide"  in media tab for additional information. Please  also refer to " Patient Self Inventory" in the Media  tab for reviewed elements of pertinent patient history.  Laurie Palmer participated in the discussions, expressed understanding, and voiced agreement with the above plans.  All questions were answered to her satisfaction. she is encouraged to contact clinic should she have any questions or concerns prior to her return visit.   Follow up plan: - Return in about 10 days (around 12/11/2019) for F/U with Meter and Logs Only - no Labs.  Glade Lloyd, MD [facility] [address] [place], [place] [address] Phone: [phone]  Fax: [phone]    12/01/2019, 3:06 PM  This note was partially dictated with voice recognition software. Similar sounding words can be transcribed inadequately or may not  be corrected upon review.

## 2019-12-01 NOTE — Patient Instructions (Signed)
Goals  Follow My Plate Cut out sweet tea Drink black coffee Drink only water  Eat three meals per day  Avoid snacks between meals Test blood sugars 4 times per day Walk 30 minutes 3 times per week. Get A1C down to 7%

## 2019-12-01 NOTE — Patient Instructions (Signed)

## 2019-12-08 ENCOUNTER — Encounter: Payer: Self-pay | Admitting: Nutrition

## 2019-12-10 DIAGNOSIS — E7800 Pure hypercholesterolemia, unspecified: Secondary | ICD-10-CM | POA: Diagnosis not present

## 2019-12-10 DIAGNOSIS — E1165 Type 2 diabetes mellitus with hyperglycemia: Secondary | ICD-10-CM | POA: Diagnosis not present

## 2019-12-10 DIAGNOSIS — Z299 Encounter for prophylactic measures, unspecified: Secondary | ICD-10-CM | POA: Diagnosis not present

## 2019-12-10 DIAGNOSIS — Z713 Dietary counseling and surveillance: Secondary | ICD-10-CM | POA: Diagnosis not present

## 2019-12-12 DIAGNOSIS — E119 Type 2 diabetes mellitus without complications: Secondary | ICD-10-CM | POA: Diagnosis not present

## 2019-12-17 ENCOUNTER — Encounter: Payer: Self-pay | Admitting: Nutrition

## 2019-12-17 ENCOUNTER — Ambulatory Visit (INDEPENDENT_AMBULATORY_CARE_PROVIDER_SITE_OTHER): Payer: Medicare Other | Admitting: "Endocrinology

## 2019-12-17 ENCOUNTER — Encounter: Payer: Medicare Other | Attending: "Endocrinology | Admitting: Nutrition

## 2019-12-17 ENCOUNTER — Other Ambulatory Visit: Payer: Self-pay

## 2019-12-17 ENCOUNTER — Encounter: Payer: Self-pay | Admitting: "Endocrinology

## 2019-12-17 VITALS — BP 124/78 | HR 68 | Ht 71.0 in | Wt 264.2 lb

## 2019-12-17 DIAGNOSIS — E559 Vitamin D deficiency, unspecified: Secondary | ICD-10-CM

## 2019-12-17 DIAGNOSIS — E782 Mixed hyperlipidemia: Secondary | ICD-10-CM | POA: Insufficient documentation

## 2019-12-17 DIAGNOSIS — I10 Essential (primary) hypertension: Secondary | ICD-10-CM

## 2019-12-17 DIAGNOSIS — E1159 Type 2 diabetes mellitus with other circulatory complications: Secondary | ICD-10-CM | POA: Insufficient documentation

## 2019-12-17 MED ORDER — FREESTYLE LIBRE 14 DAY READER DEVI: 1.0000 | Freq: Once | 0 refills | Status: AC

## 2019-12-17 MED ORDER — FREESTYLE LIBRE 14 DAY SENSOR MISC: 1.0000 | 2 refills | Status: DC

## 2019-12-17 NOTE — Progress Notes (Signed)
Medical Nutrition Therapy:  Appt start time: 945 end time:  1015  Assessment:  Primary concerns today: DM Type 2 . Follow up. Saw Dr.  Dorris Fetch today. BS are doing much better 80-140' s in am and less than 160 before meals. BS are improved.  She has cut out snacks, trying to eat meals on time. Lives by herself. 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 She notes she has too much stress and is going to stop her night time sitting.  Skipped breakfast this am and took 12 units of insulin. BS was 84 this am when she took her insulin at home without eating.. Had BS of 54 in office and was given 60 g CHO of juice and peaches and BS came up into the 89. She felt better. She was going to get something for breakfast when she left. Has glucose tablets in her bag.     She now realizes what she did wrong an knows not to skip meals or take insulin without eating. Saw Dr. Dorris Fetch. He reduced her Levermeir to 50 units a day now and still has 10 units of Novolog with meals. Metformin 1000 mg BID and Victoza.   Lab Results  Component Value Date   HGBA1C 11.7 10/06/2019   CMP Latest Ref Rng & Units 05/01/2019 01/18/2016 06/28/2015  Glucose 65 - 99 mg/dL - - -  BUN 4 - 21 13 - -  Creatinine 0.5 - 1.1 0.7 0.60 0.60  Sodium 135 - 145 mmol/L - - -  Potassium 3.5 - 5.1 mmol/L - - -  Chloride 101 - 111 mmol/L - - -  CO2 22 - 32 mmol/L - - -  Calcium 8.9 - 10.3 mg/dL - - -  Total Protein 6.5 - 8.1 g/dL - - -  Total Bilirubin 0.3 - 1.2 mg/dL - - -  Alkaline Phos 38 - 126 U/L - - -  AST 15 - 41 U/L - - -  ALT 14 - 54 U/L - - -   Lipid Panel     Component Value Date/Time   CHOL 299 (A) 05/09/2019 0000   TRIG 237 (A) 05/09/2019 0000   HDL 41 05/09/2019 0000   LDLCALC 211 05/09/2019 0000    Preferred Learning Style:   Auditory  Visual  Hands on     Learning Readiness:    Ready  Change in progress   MEDICATIONS:   DIETARY INTAKE:  Eats 2-3  meals per day   Usual physical activity: ADL  Estimated energy needs: 1200 calories 135  g carbohydrates 90 g protein 33 g fat  Progress Towards Goal(s):  In progress.   Nutritional Diagnosis:  NB-1.1 Food and nutrition-related knowledge deficit As related to Diabetes.  As evidenced by A1C 11.4%..    Intervention:  Nutrition and Diabetes education provided on My Plate, CHO counting, meal planning, portion sizes, timing of meals, avoiding snacks between meals unless having a low blood sugar, target ranges for A1C and blood sugars, signs/symptoms and treatment of hyper/hypoglycemia, monitoring blood sugars, taking medications as prescribed, benefits of exercising 30 minutes per day and prevention of complications of DM. Marland Kitchen Goals  Eat meals on time Do not take meal insulin is BS is less than 90 mg/dl. Do not take insulin if you do not eat a meal. Take 50 units of Levermir at night before bed. Eat 30 g CHO at meals.  Teaching Method Utilized:  Visual Auditory Hands on  Handouts given during visit include:  The Plate Method  Diabetes Instructions.  Barriers to learning/adherence to lifestyle change: none  Demonstrated degree of understanding via:  Teach Back   Monitoring/Evaluation:  Dietary intake, exercise, , and body weight in 1 month(s).

## 2019-12-17 NOTE — Patient Instructions (Signed)
Goals  Eat meals on time Do not take meal insulin is BS is less than 90 mg/dl. Do not take insulin if you do not eat a meal. Take 50 units of Levermir at night before bed. Eat 30 g CHO at meals

## 2019-12-17 NOTE — Patient Instructions (Signed)

## 2019-12-17 NOTE — Progress Notes (Signed)
12/17/2019, 9:52 AM  Endocrinology follow-up note   Subjective:    Patient ID: Laurie Palmer, female    DOB: Jan 14, 1946.  Laurie Palmer is being seen in follow-up after she was seen in consultation for management of currently uncontrolled symptomatic diabetes requested by  Monico Blitz, MD.   Past Medical History:  Diagnosis Date  . Colon cancer ([place])   . Elevated carcinoembryonic antigen (CEA)   . Hypercalcemia   . Mass of left thigh     Past Surgical History:  Procedure Laterality Date  . IRRIGATION AND DEBRIDEMENT ABSCESS Left 03/25/2015   Procedure: IRRIGATION AND DEBRIDEMENT ABSCESS LEFT THIGH WITH BIOPSIES AND WOUND VAC PLACEMENT;  Surgeon: Hall Busing, MD;  Location: [facility];  Service: General;  Laterality: Left;    Social History   Socioeconomic History  . Marital status: Widowed    Spouse name: Not on file  . Number of children: Not on file  . Years of education: Not on file  . Highest education level: Not on file  Occupational History  . Not on file  Tobacco Use  . Smoking status: Never Smoker  . Smokeless tobacco: Never Used  Substance and Sexual Activity  . Alcohol use: No    Alcohol/week: 0.0 standard drinks  . Drug use: No  . Sexual activity: Not on file  Other Topics Concern  . Not on file  Social History Narrative  . Not on file   Social Determinants of Health   Financial Resource Strain:   . Difficulty of Paying Living Expenses:   Food Insecurity:   . Worried About Charity fundraiser in the Last Year:   . Arboriculturist in the Last Year:   Transportation Needs:   . Film/video editor (Medical):   Marland Kitchen Lack of Transportation (Non-Medical):   Physical Activity:   . Days of Exercise per Week:   . Minutes of Exercise per Session:   Stress:   . Feeling of Stress :   Social Connections:   . Frequency of Communication with Friends and Family:    . Frequency of Social Gatherings with Friends and Family:   . Attends Religious Services:   . Active Member of Clubs or Organizations:   . Attends Archivist Meetings:   Marland Kitchen Marital Status:     Family History  Problem Relation Age of Onset  . Diabetes Mother   . Hypertension Mother   . Hyperlipidemia Mother   . Heart attack Mother   . Heart failure Mother   . Prostate cancer Father   . Prostate cancer Brother     Outpatient Encounter Medications as of 12/17/2019  Medication Sig  . aspirin (ASPIRIN EC) 81 MG EC tablet Take 81 mg by mouth daily.   Marland Kitchen CINNAMON PO Take 1 tablet by mouth daily.  . Continuous Blood Gluc Receiver (FREESTYLE LIBRE 14 DAY READER) DEVI 1 each by Does not apply route once for 1 dose.  . Continuous Blood Gluc Sensor (FREESTYLE LIBRE 14 DAY SENSOR) MISC Inject 1 each into the skin every 14 (fourteen) days. Use as  directed.  . diclofenac (VOLTAREN) 75 MG EC tablet Take 75 mg by mouth 2 (two) times daily.  . insulin aspart (NOVOLOG) 100 UNIT/ML injection Inject 10-16 Units into the skin 3 (three) times daily before meals.  . insulin detemir (LEVEMIR) 100 UNIT/ML injection Inject 60 Units into the skin at bedtime.  . liraglutide (VICTOZA) 18 MG/3ML SOPN Inject 1.8 mg into the skin daily.  . metFORMIN (GLUCOPHAGE) 1000 MG tablet Take 1,000 mg by mouth 2 (two) times daily with a meal.  . pravastatin (PRAVACHOL) 20 MG tablet Take 20 mg by mouth daily.  . Vitamin D, Ergocalciferol, (DRISDOL) 1.25 MG (50000 UNIT) CAPS capsule Take 1 capsule (50,000 Units total) by mouth every 7 (seven) days.   No facility-administered encounter medications on file as of 12/17/2019.    ALLERGIES: Allergies  Allergen Reactions  . Shellfish Allergy Swelling and Anaphylaxis  . Lipitor [Atorvastatin] Nausea Only    VACCINATION STATUS: There is no immunization history for the selected administration types on file for this patient.  Diabetes She presents for her follow-up  diabetic visit. She has type 2 diabetes mellitus. Onset time: She was diagnosed at approximate age of 45 years. Her disease course has been improving. There are no hypoglycemic associated symptoms. Pertinent negatives for hypoglycemia include no confusion, headaches, pallor or seizures. Associated symptoms include fatigue, polydipsia and polyuria. Pertinent negatives for diabetes include no chest pain and no polyphagia. There are no hypoglycemic complications. Symptoms are improving. There are no diabetic complications. Risk factors for coronary artery disease include dyslipidemia, diabetes mellitus, obesity, sedentary lifestyle and post-menopausal. Current diabetic treatments: She is currently on Levemir 60-65 units nightly, NovoLog 10-16 units 3 times daily, Victoza 1.2 mg daily, Metformin 100 mg p.o. twice daily. Her weight is fluctuating minimally. She is following a generally unhealthy diet. When asked about meal planning, she reported none. She has not had a previous visit with a dietitian. She rarely participates in exercise. Her home blood glucose trend is decreasing steadily. Her breakfast blood glucose range is generally 140-180 mg/dl. Her lunch blood glucose range is generally 140-180 mg/dl. Her dinner blood glucose range is generally 140-180 mg/dl. Her bedtime blood glucose range is generally 140-180 mg/dl. Her overall blood glucose range is 140-180 mg/dl. (She presents with improving glycemic profile on her logs and meter.  Average blood glucose is 162.  She needs and NovoLog dosing errors, and to keep even when Premeal blood glucose readings are below 90 mg per DL.  No major hypoglycemia.    Her recent A1c was 11.7% in May 2021.)  Hyperlipidemia This is a chronic problem. The current episode started more than 1 year ago. The problem is uncontrolled. Exacerbating diseases include diabetes and obesity. Pertinent negatives include no chest pain, myalgias or shortness of breath. Current  antihyperlipidemic treatment includes statins. Risk factors for coronary artery disease include dyslipidemia, diabetes mellitus, family history, obesity, hypertension, a sedentary lifestyle and post-menopausal.     Review of Systems  Constitutional: Positive for fatigue. Negative for chills, fever and unexpected weight change.  HENT: Negative for trouble swallowing and voice change.   Eyes: Negative for visual disturbance.  Respiratory: Negative for cough, shortness of breath and wheezing.   Cardiovascular: Negative for chest pain, palpitations and leg swelling.  Gastrointestinal: Negative for diarrhea, nausea and vomiting.  Endocrine: Positive for polydipsia and polyuria. Negative for cold intolerance, heat intolerance and polyphagia.  Musculoskeletal: Negative for arthralgias and myalgias.  Skin: Negative for color change, pallor, rash and wound.  Neurological: Negative for seizures and headaches.  Psychiatric/Behavioral: Negative for confusion and suicidal ideas.    Objective:    Vitals with BMI 12/17/2019 12/01/2019 01/25/2016  Height 5\' 11"  5\' 11"  -  Weight 264 lbs 3 oz 263 lbs 6 oz 271 lbs 9 oz  BMI 13.24 40.10 -  Systolic 272 536 644  Diastolic 78 80 71  Pulse 68 85 86    BP 124/78   Pulse 68   Ht 5\' 11"  (1.803 m)   Wt 264 lb 3.2 oz (119.8 kg)   BMI 36.85 kg/m   Wt Readings from Last 3 Encounters:  12/17/19 264 lb 3.2 oz (119.8 kg)  12/01/19 263 lb 6.4 oz (119.5 kg)  01/25/16 271 lb 9 oz (123.2 kg)     Physical Exam Constitutional:      Appearance: She is well-developed.  HENT:     Head: Normocephalic and atraumatic.  Neck:     Thyroid: No thyromegaly.     Trachea: No tracheal deviation.  Cardiovascular:     Rate and Rhythm: Normal rate and regular rhythm.  Pulmonary:     Effort: Pulmonary effort is normal.  Abdominal:     Tenderness: There is no abdominal tenderness. There is no guarding.  Musculoskeletal:        General: Normal range of motion.      Cervical back: Normal range of motion and neck supple.     Comments: Normal foot exam today.  Skin:    General: Skin is warm and dry.     Coloration: Skin is not pale.     Findings: No erythema or rash.  Neurological:     Mental Status: She is alert and oriented to person, place, and time.     Cranial Nerves: No cranial nerve deficit.     Coordination: Coordination normal.     Deep Tendon Reflexes: Reflexes are normal and symmetric.  Psychiatric:        Judgment: Judgment normal.       CMP ( most recent) CMP     Component Value Date/Time   NA 138 03/26/2015 0540   K 4.3 03/26/2015 0540   CL 104 03/26/2015 0540   CO2 29 03/26/2015 0540   GLUCOSE 135 (H) 03/26/2015 0540   BUN 13 05/01/2019 0000   CREATININE 0.7 05/01/2019 0000   CREATININE 0.60 01/18/2016 1413   CALCIUM 10.1 03/26/2015 0540   PROT 8.4 (H) 03/23/2015 2314   ALBUMIN 3.6 03/23/2015 2314   AST 18 03/23/2015 2314   ALT 18 03/23/2015 2314   ALKPHOS 135 (H) 03/23/2015 2314   BILITOT 0.5 03/23/2015 2314   GFRNONAA >60 03/26/2015 0540   GFRAA >60 03/26/2015 0540     Diabetic Labs (most recent): Lab Results  Component Value Date   HGBA1C 11.7 10/06/2019    CMP Latest Ref Rng & Units 05/01/2019 01/18/2016 06/28/2015  Glucose 65 - 99 mg/dL - - -  BUN 4 - 21 13 - -  Creatinine 0.5 - 1.1 0.7 0.60 0.60  Sodium 135 - 145 mmol/L - - -  Potassium 3.5 - 5.1 mmol/L - - -  Chloride 101 - 111 mmol/L - - -  CO2 22 - 32 mmol/L - - -  Calcium 8.9 - 10.3 mg/dL - - -  Total Protein 6.5 - 8.1 g/dL - - -  Total Bilirubin 0.3 - 1.2 mg/dL - - -  Alkaline Phos 38 - 126 U/L - - -  AST 15 - 41 U/L - - -  ALT 14 - 54 U/L - - -   Lipid Panel     Component Value Date/Time   CHOL 299 (A) 05/09/2019 0000   TRIG 237 (A) 05/09/2019 0000   HDL 41 05/09/2019 0000   LDLCALC 211 05/09/2019 0000      Assessment & Plan:   1. DM type 2 causing vascular disease ([place])   - Laurie Palmer has currently uncontrolled  symptomatic type 2 DM since  74 years of age.  She presents with improving glycemic profile on her logs and meter.  Average blood glucose is 162.  She needs and NovoLog dosing errors, and to keep even when Premeal blood glucose readings are below 90 mg per DL.  No major hypoglycemia.    Her recent A1c was 11.7% in May 2021. - I had a long discussion with her about the progressive nature of diabetes and the pathology behind its complications. -her diabetes is complicated by obesity/sedentary life and she remains at a high risk for more acute and chronic complications which include CAD, CVA, CKD, retinopathy, and neuropathy. These are all discussed in detail with her.  - I have counseled her on diet  and weight management  by adopting a carbohydrate restricted/protein rich diet. Patient is encouraged to switch to  unprocessed or minimally processed     complex starch and increased protein intake (animal or plant source), fruits, and vegetables. -  she is advised to stick to a routine mealtimes to eat 3 meals  a day and avoid unnecessary snacks ( to snack only to correct hypoglycemia).   - she  admits there is a room for improvement in her diet and drink choices. -  Suggestion is made for her to avoid simple carbohydrates  from her diet including Cakes, Sweet Desserts / Pastries, Ice Cream, Soda (diet and regular), Sweet Tea, Candies, Chips, Cookies, Sweet Pastries,  Store Bought Juices, Alcohol in Excess of  1-2 drinks a day, Artificial Sweeteners, Coffee Creamer, and "Sugar-free" Products. This will help patient to have stable blood glucose profile and potentially avoid unintended weight gain.  - she will be scheduled with Jearld Fenton, RDN, CDE for diabetes education.  - I have approached her with the following individualized plan to manage  her diabetes and patient agrees:   - she will continue to need intensive treatment with basal/bolus insulin in order for her to achieve and maintain control of  diabetes to target.   -Based on her presentation with near target glycemic profile, she is advised to lower her Levemir to 50 units nightly, continue NovoLog 10 Units 3 times a day with meals  for pre-meal BG readings of 90-150mg /dl, plus patient specific correction dose for unexpected hyperglycemia above 150mg /dl, associated with strict monitoring of glucose 4 times a day-before meals and at bedtime. -She will benefit from a CGM.  I discussed and ordered the freestyle libre device for her. - she is warned not to take insulin without proper monitoring per orders. - Adjustment parameters are given to her for hypo and hyperglycemia in writing. - she is encouraged to call clinic for blood glucose levels less than 70 or above 300 mg /dl. - she is advised to continue Metformin 1000 mg p.o. twice daily and Victoza  1.8 mg subcutaneously daily, therapeutically suitable for patient .  - Specific targets for  A1c;  LDL, HDL,  and Triglycerides were discussed with the patient.  2) Blood Pressure /Hypertension: Her blood pressure is controlled to target. she is  advised to bring all of her medications, currently on not on ACE inhibitor/ARB.    3) Lipids/Hyperlipidemia:   Review of her recent lipid panel showed uncontrolled  LDL at 211 .  she  is advised to resume and continue Pravachol 20 mg p.o. daily at bedtime.    Side effects and precautions discussed with her.  4)  Weight/Diet:  Body mass index is 36.85 kg/m.  -   clearly complicating her diabetes care.   she is  a candidate for weight loss. I discussed with her the fact that loss of 5 - 10% of her  current body weight will have the most impact on her diabetes management.  Exercise, and detailed carbohydrates information provided  -  detailed on discharge instructions.  5) vitamin D deficiency: Recently 11.7 She is advised to resume her ergocalciferol 50,000 units weekly for 12 weeks.  6) Chronic Care/Health Maintenance:  -she  Is non Statin  medications and  is encouraged to initiate and continue to follow up with Ophthalmology, Dentist,  Podiatrist at least yearly or according to recommendations, and advised to   stay away from smoking. I have recommended yearly flu vaccine and pneumonia vaccine at least every 5 years; moderate intensity exercise for up to 150 minutes weekly; and  sleep for at least 7 hours a day.  - she is  advised to maintain close follow up with Monico Blitz, MD for primary care needs, as well as her other providers for optimal and coordinated care.  - Time spent on this patient care encounter:  35 min, of which > 50% was spent in  counseling and the rest reviewing her blood glucose logs , discussing her hypoglycemia and hyperglycemia episodes, reviewing her current and  previous labs / studies  ( including abstraction from other facilities) and medications  doses and developing a  long term treatment plan and documenting her care.   Please refer to Patient Instructions for Blood Glucose Monitoring and Insulin/Medications Dosing Guide"  in media tab for additional information. Please  also refer to " Patient Self Inventory" in the Media  tab for reviewed elements of pertinent patient history.  Laurie Palmer participated in the discussions, expressed understanding, and voiced agreement with the above plans.  All questions were answered to her satisfaction. she is encouraged to contact clinic should she have any questions or concerns prior to her return visit.    Follow up plan: - Return in about 5 weeks (around 01/21/2020) for F/U with Pre-visit Labs, Meter, Logs, A1c here.Glade Lloyd, MD [facility] [address] [place], [place] [address] Phone: [phone]  Fax: [phone]    12/17/2019, 9:52 AM  This note was partially dictated with voice recognition software. Similar sounding words can be transcribed inadequately or may not  be corrected upon  review.

## 2020-01-13 DIAGNOSIS — E119 Type 2 diabetes mellitus without complications: Secondary | ICD-10-CM | POA: Diagnosis not present

## 2020-01-14 DIAGNOSIS — E1159 Type 2 diabetes mellitus with other circulatory complications: Secondary | ICD-10-CM | POA: Diagnosis not present

## 2020-01-15 LAB — COMPREHENSIVE METABOLIC PANEL
ALT: 21 IU/L (ref 0–32)
AST: 16 IU/L (ref 0–40)
Albumin/Globulin Ratio: 1.4 (ref 1.2–2.2)
Albumin: 4.2 g/dL (ref 3.7–4.7)
Alkaline Phosphatase: 119 IU/L (ref 48–121)
BUN/Creatinine Ratio: 20 (ref 12–28)
BUN: 18 mg/dL (ref 8–27)
Bilirubin Total: 0.4 mg/dL (ref 0.0–1.2)
CO2: 24 mmol/L (ref 20–29)
Calcium: 11.1 mg/dL — ABNORMAL HIGH (ref 8.7–10.3)
Chloride: 103 mmol/L (ref 96–106)
Creatinine, Ser: 0.89 mg/dL (ref 0.57–1.00)
GFR calc Af Amer: 74 mL/min/{1.73_m2} (ref >59)
GFR calc non Af Amer: 64 mL/min/{1.73_m2} (ref >59)
Globulin, Total: 3 g/dL (ref 1.5–4.5)
Glucose: 73 mg/dL (ref 65–99)
Potassium: 4.4 mmol/L (ref 3.5–5.2)
Sodium: 141 mmol/L (ref 134–144)
Total Protein: 7.2 g/dL (ref 6.0–8.5)

## 2020-01-15 LAB — LIPID PANEL
Chol/HDL Ratio: 6.9 ratio — ABNORMAL HIGH (ref 0.0–4.4)
Cholesterol, Total: 282 mg/dL — ABNORMAL HIGH (ref 100–199)
HDL: 41 mg/dL (ref >39)
LDL Chol Calc (NIH): 198 mg/dL — ABNORMAL HIGH (ref 0–99)
Triglycerides: 222 mg/dL — ABNORMAL HIGH (ref 0–149)
VLDL Cholesterol Cal: 43 mg/dL — ABNORMAL HIGH (ref 5–40)

## 2020-01-15 LAB — T4, FREE: Free T4: 1.34 ng/dL (ref 0.82–1.77)

## 2020-01-15 LAB — TSH: TSH: 1.64 u[IU]/mL (ref 0.450–4.500)

## 2020-01-22 ENCOUNTER — Ambulatory Visit: Payer: Medicare Other | Admitting: Nutrition

## 2020-01-22 ENCOUNTER — Ambulatory Visit: Payer: Medicare Other | Admitting: "Endocrinology

## 2020-01-27 ENCOUNTER — Other Ambulatory Visit: Payer: Self-pay

## 2020-01-27 ENCOUNTER — Encounter: Payer: Medicare Other | Attending: "Endocrinology | Admitting: Nutrition

## 2020-01-27 ENCOUNTER — Encounter: Payer: Self-pay | Admitting: "Endocrinology

## 2020-01-27 ENCOUNTER — Ambulatory Visit (INDEPENDENT_AMBULATORY_CARE_PROVIDER_SITE_OTHER): Payer: Medicare Other | Admitting: "Endocrinology

## 2020-01-27 VITALS — BP 120/76 | HR 80 | Ht 71.0 in | Wt 261.8 lb

## 2020-01-27 DIAGNOSIS — E1159 Type 2 diabetes mellitus with other circulatory complications: Secondary | ICD-10-CM | POA: Diagnosis not present

## 2020-01-27 DIAGNOSIS — E559 Vitamin D deficiency, unspecified: Secondary | ICD-10-CM

## 2020-01-27 DIAGNOSIS — I10 Essential (primary) hypertension: Secondary | ICD-10-CM

## 2020-01-27 DIAGNOSIS — E782 Mixed hyperlipidemia: Secondary | ICD-10-CM

## 2020-01-27 LAB — POCT GLYCOSYLATED HEMOGLOBIN (HGB A1C): Hemoglobin A1C: 9.6 % — AB (ref 4.0–5.6)

## 2020-01-27 NOTE — Patient Instructions (Signed)

## 2020-01-27 NOTE — Patient Instructions (Signed)
Goals  Keep working on eating meals on time Drink water Take medications as prescribed. Take Vitamin D 50,000 units weekly

## 2020-01-27 NOTE — Progress Notes (Signed)
01/27/2020, 4:33 PM  Endocrinology follow-up note   Subjective:    Patient ID: Laurie Palmer, female    DOB: 1945/10/11.  Laurie Palmer is being seen in follow-up after she was seen in consultation for management of currently uncontrolled symptomatic diabetes requested by  Monico Blitz, MD.   Past Medical History:  Diagnosis Date  . Colon cancer ([place])   . Elevated carcinoembryonic antigen (CEA)   . Hypercalcemia   . Mass of left thigh     Past Surgical History:  Procedure Laterality Date  . IRRIGATION AND DEBRIDEMENT ABSCESS Left 03/25/2015   Procedure: IRRIGATION AND DEBRIDEMENT ABSCESS LEFT THIGH WITH BIOPSIES AND WOUND VAC PLACEMENT;  Surgeon: Hall Busing, MD;  Location: [facility];  Service: General;  Laterality: Left;    Social History   Socioeconomic History  . Marital status: Widowed    Spouse name: Not on file  . Number of children: Not on file  . Years of education: Not on file  . Highest education level: Not on file  Occupational History  . Not on file  Tobacco Use  . Smoking status: Never Smoker  . Smokeless tobacco: Never Used  Substance and Sexual Activity  . Alcohol use: No    Alcohol/week: 0.0 standard drinks  . Drug use: No  . Sexual activity: Not on file  Other Topics Concern  . Not on file  Social History Narrative  . Not on file   Social Determinants of Health   Financial Resource Strain:   . Difficulty of Paying Living Expenses: Not on file  Food Insecurity:   . Worried About Charity fundraiser in the Last Year: Not on file  . Ran Out of Food in the Last Year: Not on file  Transportation Needs:   . Lack of Transportation (Medical): Not on file  . Lack of Transportation (Non-Medical): Not on file  Physical Activity:   . Days of Exercise per Week: Not on file  . Minutes of Exercise per Session: Not on file  Stress:   . Feeling of Stress : Not  on file  Social Connections:   . Frequency of Communication with Friends and Family: Not on file  . Frequency of Social Gatherings with Friends and Family: Not on file  . Attends Religious Services: Not on file  . Active Member of Clubs or Organizations: Not on file  . Attends Archivist Meetings: Not on file  . Marital Status: Not on file    Family History  Problem Relation Age of Onset  . Diabetes Mother   . Hypertension Mother   . Hyperlipidemia Mother   . Heart attack Mother   . Heart failure Mother   . Prostate cancer Father   . Prostate cancer Brother     Outpatient Encounter Medications as of 01/27/2020  Medication Sig  . aspirin (ASPIRIN EC) 81 MG EC tablet Take 81 mg by mouth daily.   Marland Kitchen CINNAMON PO Take 1 tablet by mouth daily.  . Continuous Blood Gluc Sensor (FREESTYLE LIBRE 14 DAY SENSOR) MISC Inject 1 each into the skin every  14 (fourteen) days. Use as directed.  . diclofenac (VOLTAREN) 75 MG EC tablet Take 75 mg by mouth 2 (two) times daily.  . insulin aspart (NOVOLOG) 100 UNIT/ML injection Inject 12-18 Units into the skin 3 (three) times daily before meals.  . insulin detemir (LEVEMIR) 100 UNIT/ML injection Inject 60 Units into the skin at bedtime.  . liraglutide (VICTOZA) 18 MG/3ML SOPN Inject 1.8 mg into the skin daily.  . metFORMIN (GLUCOPHAGE) 1000 MG tablet Take 1,000 mg by mouth 2 (two) times daily with a meal.  . pravastatin (PRAVACHOL) 20 MG tablet Take 20 mg by mouth daily.  . Vitamin D, Ergocalciferol, (DRISDOL) 1.25 MG (50000 UNIT) CAPS capsule Take 1 capsule (50,000 Units total) by mouth every 7 (seven) days.   No facility-administered encounter medications on file as of 01/27/2020.    ALLERGIES: Allergies  Allergen Reactions  . Shellfish Allergy Swelling and Anaphylaxis  . Lipitor [Atorvastatin] Nausea Only    VACCINATION STATUS: There is no immunization history for the selected administration types on file for this  patient.  Diabetes She presents for her follow-up diabetic visit. She has type 2 diabetes mellitus. Onset time: She was diagnosed at approximate age of 60 years. Her disease course has been improving. There are no hypoglycemic associated symptoms. Pertinent negatives for hypoglycemia include no confusion, headaches, pallor or seizures. Associated symptoms include fatigue, polydipsia and polyuria. Pertinent negatives for diabetes include no chest pain and no polyphagia. There are no hypoglycemic complications. Symptoms are improving. There are no diabetic complications. Risk factors for coronary artery disease include dyslipidemia, diabetes mellitus, obesity, sedentary lifestyle and post-menopausal. Current diabetic treatments: She is currently on Levemir 60-65 units nightly, NovoLog 10-16 units 3 times daily, Victoza 1.2 mg daily, Metformin 100 mg p.o. twice daily. Her weight is fluctuating minimally. She is following a generally unhealthy diet. When asked about meal planning, she reported none. She has not had a previous visit with a dietitian. She rarely participates in exercise. Her home blood glucose trend is decreasing steadily. Her breakfast blood glucose range is generally 140-180 mg/dl. Her lunch blood glucose range is generally 140-180 mg/dl. Her dinner blood glucose range is generally 140-180 mg/dl. Her bedtime blood glucose range is generally 140-180 mg/dl. Her overall blood glucose range is 140-180 mg/dl. (She presents with improving glycemic profile on her logs and meter.  Average blood glucose is  150- 175.  Her point-of-care A1c is 9.6%, improving from 11.7%.  She did not document or report hypoglycemia.   )  Hyperlipidemia This is a chronic problem. The current episode started more than 1 year ago. The problem is uncontrolled. Exacerbating diseases include diabetes and obesity. Pertinent negatives include no chest pain, myalgias or shortness of breath. Current antihyperlipidemic treatment  includes statins. Risk factors for coronary artery disease include dyslipidemia, diabetes mellitus, family history, obesity, hypertension, a sedentary lifestyle and post-menopausal.     Review of Systems  Constitutional: Positive for fatigue. Negative for chills, fever and unexpected weight change.  HENT: Negative for trouble swallowing and voice change.   Eyes: Negative for visual disturbance.  Respiratory: Negative for cough, shortness of breath and wheezing.   Cardiovascular: Negative for chest pain, palpitations and leg swelling.  Gastrointestinal: Negative for diarrhea, nausea and vomiting.  Endocrine: Positive for polydipsia and polyuria. Negative for cold intolerance, heat intolerance and polyphagia.  Musculoskeletal: Negative for arthralgias and myalgias.  Skin: Negative for color change, pallor, rash and wound.  Neurological: Negative for seizures and headaches.  Psychiatric/Behavioral: Negative for  confusion and suicidal ideas.    Objective:    Vitals with BMI 01/27/2020 12/17/2019 12/01/2019  Height 5\' 11"  5\' 11"  5\' 11"   Weight 261 lbs 13 oz 264 lbs 3 oz 263 lbs 6 oz  BMI 36.53 39.76 73.41  Systolic 937 902 409  Diastolic 76 78 80  Pulse 80 68 85    BP 120/76   Pulse 80   Ht 5\' 11"  (1.803 m)   Wt 261 lb 12.8 oz (118.8 kg)   BMI 36.51 kg/m   Wt Readings from Last 3 Encounters:  01/27/20 261 lb 12.8 oz (118.8 kg)  12/17/19 264 lb 3.2 oz (119.8 kg)  12/01/19 263 lb 6.4 oz (119.5 kg)     Physical Exam Constitutional:      Appearance: She is well-developed.  HENT:     Head: Normocephalic and atraumatic.  Neck:     Thyroid: No thyromegaly.     Trachea: No tracheal deviation.  Cardiovascular:     Rate and Rhythm: Normal rate and regular rhythm.  Pulmonary:     Effort: Pulmonary effort is normal.  Abdominal:     Tenderness: There is no abdominal tenderness. There is no guarding.  Musculoskeletal:        General: Normal range of motion.     Cervical back: Normal  range of motion and neck supple.     Comments: Normal foot exam today.  Skin:    General: Skin is warm and dry.     Coloration: Skin is not pale.     Findings: No erythema or rash.  Neurological:     Mental Status: She is alert and oriented to person, place, and time.     Cranial Nerves: No cranial nerve deficit.     Coordination: Coordination normal.     Deep Tendon Reflexes: Reflexes are normal and symmetric.  Psychiatric:        Judgment: Judgment normal.      CMP ( most recent) CMP     Component Value Date/Time   NA 141 01/14/2020 0955   K 4.4 01/14/2020 0955   CL 103 01/14/2020 0955   CO2 24 01/14/2020 0955   GLUCOSE 73 01/14/2020 0955   GLUCOSE 135 (H) 03/26/2015 0540   BUN 18 01/14/2020 0955   CREATININE 0.89 01/14/2020 0955   CALCIUM 11.1 (H) 01/14/2020 0955   PROT 7.2 01/14/2020 0955   ALBUMIN 4.2 01/14/2020 0955   AST 16 01/14/2020 0955   ALT 21 01/14/2020 0955   ALKPHOS 119 01/14/2020 0955   BILITOT 0.4 01/14/2020 0955   GFRNONAA 64 01/14/2020 0955   GFRAA 74 01/14/2020 0955     Diabetic Labs (most recent): Lab Results  Component Value Date   HGBA1C 9.6 (A) 01/27/2020   HGBA1C 11.7 10/06/2019    CMP Latest Ref Rng & Units 01/14/2020 05/01/2019 01/18/2016  Glucose 65 - 99 mg/dL 73 - -  BUN 8 - 27 mg/dL 18 13 -  Creatinine 0.57 - 1.00 mg/dL 0.89 0.7 0.60  Sodium 134 - 144 mmol/L 141 - -  Potassium 3.5 - 5.2 mmol/L 4.4 - -  Chloride 96 - 106 mmol/L 103 - -  CO2 20 - 29 mmol/L 24 - -  Calcium 8.7 - 10.3 mg/dL 11.1(H) - -  Total Protein 6.0 - 8.5 g/dL 7.2 - -  Total Bilirubin 0.0 - 1.2 mg/dL 0.4 - -  Alkaline Phos 48 - 121 IU/L 119 - -  AST 0 - 40 IU/L 16 - -  ALT 0 - 32 IU/L 21 - -   Lipid Panel     Component Value Date/Time   CHOL 282 (H) 01/14/2020 0955   TRIG 222 (H) 01/14/2020 0955   HDL 41 01/14/2020 0955   CHOLHDL 6.9 (H) 01/14/2020 0955   LDLCALC 198 (H) 01/14/2020 0955   LABVLDL 43 (H) 01/14/2020 0955      Assessment & Plan:    1. DM type 2 causing vascular disease ([place])   - Felecia Shelling Daigler has currently uncontrolled symptomatic type 2 DM since  74 years of age.  She presents with improving glycemic profile on her logs and meter.  Average blood glucose is  150- 175.  Her point-of-care A1c is 9.6%, improving from 11.7%.  She did not document or report hypoglycemia.    - I had a long discussion with her about the progressive nature of diabetes and the pathology behind its complications. -her diabetes is complicated by obesity/sedentary life and she remains at a high risk for more acute and chronic complications which include CAD, CVA, CKD, retinopathy, and neuropathy. These are all discussed in detail with her.  - I have counseled her on diet  and weight management  by adopting a carbohydrate restricted/protein rich diet. Patient is encouraged to switch to  unprocessed or minimally processed     complex starch and increased protein intake (animal or plant source), fruits, and vegetables. -  she is advised to stick to a routine mealtimes to eat 3 meals  a day and avoid unnecessary snacks ( to snack only to correct hypoglycemia).   - she  admits there is a room for improvement in her diet and drink choices. -  Suggestion is made for her to avoid simple carbohydrates  from her diet including Cakes, Sweet Desserts / Pastries, Ice Cream, Soda (diet and regular), Sweet Tea, Candies, Chips, Cookies, Sweet Pastries,  Store Bought Juices, Alcohol in Excess of  1-2 drinks a day, Artificial Sweeteners, Coffee Creamer, and "Sugar-free" Products. This will help patient to have stable blood glucose profile and potentially avoid unintended weight gain.   - she will be scheduled with Jearld Fenton, RDN, CDE for diabetes education.  - I have approached her with the following individualized plan to manage  her diabetes and patient agrees:   - she will continue to need intensive treatment with basal/bolus insulin in order for her  to achieve and maintain control of diabetes to target.   -Based on her presentation with near target glycemic profile, she is advised to continue Levemir at 50 units nightly, increase NovoLog to 12  Units 3 times a day with meals  for pre-meal BG readings of 90-150mg /dl, plus patient specific correction dose for unexpected hyperglycemia above 150mg /dl, associated with strict monitoring of glucose 4 times a day-before meals and at bedtime. -She will benefit from a CGM.  I discussed and ordered the freestyle libre device for her. - she is warned not to take insulin without proper monitoring per orders. - Adjustment parameters are given to her for hypo and hyperglycemia in writing. - she is encouraged to call clinic for blood glucose levels less than 70 or above 300 mg /dl. - she is advised to continue Metformin 1000 mg p.o. twice daily, Victoza 1.8 mg subcutaneously daily.    - Specific targets for  A1c;  LDL, HDL,  and Triglycerides were discussed with the patient.  2) Blood Pressure /Hypertension: Her blood pressure is controlled to target.  she is advised to  bring all of her medications, currently on not on ACE inhibitor/ARB.    3) Lipids/Hyperlipidemia:   Review of her recent lipid panel showed uncontrolled  LDL at 211 .  Admittedly, she has not been taking her Pravachol, LDL uncontrolled at 198.  She is advised to resume taking her pravastatin 20 mg p.o. nightly.   Side effects and precautions discussed with her.  4)  Weight/Diet:  Body mass index is 36.51 kg/m.  -   clearly complicating her diabetes care.   she is  a candidate for weight loss. I discussed with her the fact that loss of 5 - 10% of her  current body weight will have the most impact on her diabetes management.  Exercise, and detailed carbohydrates information provided  -  detailed on discharge instructions.  5) vitamin D deficiency: Recently 11.7 She is advised to continue ergocalciferol 50,000 units weekly for 12 weeks.  6)  Chronic Care/Health Maintenance:  -she  Is non Statin medications and  is encouraged to initiate and continue to follow up with Ophthalmology, Dentist,  Podiatrist at least yearly or according to recommendations, and advised to   stay away from smoking. I have recommended yearly flu vaccine and pneumonia vaccine at least every 5 years; moderate intensity exercise for up to 150 minutes weekly; and  sleep for at least 7 hours a day.  - she is  advised to maintain close follow up with Monico Blitz, MD for primary care needs, as well as her other providers for optimal and coordinated care.  - Time spent on this patient care encounter:  35 min, of which > 50% was spent in  counseling and the rest reviewing her blood glucose logs , discussing her hypoglycemia and hyperglycemia episodes, reviewing her current and  previous labs / studies  ( including abstraction from other facilities) and medications  doses and developing a  long term treatment plan and documenting her care.   Please refer to Patient Instructions for Blood Glucose Monitoring and Insulin/Medications Dosing Guide"  in media tab for additional information. Please  also refer to " Patient Self Inventory" in the Media  tab for reviewed elements of pertinent patient history.  Felecia Shelling Dauphinee participated in the discussions, expressed understanding, and voiced agreement with the above plans.  All questions were answered to her satisfaction. she is encouraged to contact clinic should she have any questions or concerns prior to her return visit.  Follow up plan: - No follow-ups on file.  Glade Lloyd, MD [facility] [address] [place], [place] [address] Phone: [phone]  Fax: [phone]    01/27/2020, 4:33 PM  This note was partially dictated with voice recognition software. Similar sounding words can be transcribed inadequately or may not  be corrected upon review.

## 2020-01-27 NOTE — Progress Notes (Signed)
  Medical Nutrition Therapy:  Appt start time: 945 end time:  1015  Assessment:  Primary concerns today: DM Type 2 . Follow up. Saw Dr.  Dorris Fetch today.  Eating meals on time and better choices. Cut out sweets Eating more vegetables and taking medication. FBS: 100-160's. Taking Levermeir 50 units. Metformin 1000 mg BID. Victoza daily. And takes Novolog 12 units and sliding scale with meals. Has a Libre. Has been working on better sleeping schedule.. Feels better.    Lab Results  Component Value Date   HGBA1C 9.6 (A) 01/27/2020   CMP Latest Ref Rng & Units 01/14/2020 05/01/2019 01/18/2016  Glucose 65 - 99 mg/dL 73 - -  BUN 8 - 27 mg/dL 18 13 -  Creatinine 0.57 - 1.00 mg/dL 0.89 0.7 0.60  Sodium 134 - 144 mmol/L 141 - -  Potassium 3.5 - 5.2 mmol/L 4.4 - -  Chloride 96 - 106 mmol/L 103 - -  CO2 20 - 29 mmol/L 24 - -  Calcium 8.7 - 10.3 mg/dL 11.1(H) - -  Total Protein 6.0 - 8.5 g/dL 7.2 - -  Total Bilirubin 0.0 - 1.2 mg/dL 0.4 - -  Alkaline Phos 48 - 121 IU/L 119 - -  AST 0 - 40 IU/L 16 - -  ALT 0 - 32 IU/L 21 - -   Lipid Panel     Component Value Date/Time   CHOL 282 (H) 01/14/2020 0955   TRIG 222 (H) 01/14/2020 0955   HDL 41 01/14/2020 0955   CHOLHDL 6.9 (H) 01/14/2020 0955   LDLCALC 198 (H) 01/14/2020 0955   LABVLDL 43 (H) 01/14/2020 0955    Preferred Learning Style:   Auditory  Visual  Hands on     Learning Readiness:    Ready  Change in progress   MEDICATIONS:   DIETARY INTAKE:  Eats 2-3 meals per day   Usual physical activity: ADL  Estimated energy needs: 1200 calories 135  g carbohydrates 90 g protein 33 g fat  Progress Towards Goal(s):  In progress.   Nutritional Diagnosis:  NB-1.1 Food and nutrition-related knowledge deficit As related to Diabetes.  As evidenced by A1C 11.4%..    Intervention:  Nutrition and Diabetes education provided on My Plate, CHO counting, meal planning, portion sizes, timing of meals, avoiding snacks between meals  unless having a low blood sugar, target ranges for A1C and blood sugars, signs/symptoms and treatment of hyper/hypoglycemia, monitoring blood sugars, taking medications as prescribed, benefits of exercising 30 minutes per day and prevention of complications of DM. Marland Kitchen Goals  Keep working on eating meals on time Drink water Take medications as prescribed. Take Vitamin D 50,000 units weekly  Teaching Method Utilized: Visual Auditory Hands on  Handouts given during visit include:  The Plate Method  Diabetes Instructions.  Barriers to learning/adherence to lifestyle change: none  Demonstrated degree of understanding via:  Teach Back   Monitoring/Evaluation:  Dietary intake, exercise, , and body weight in 3 month(s).

## 2020-02-12 DIAGNOSIS — E119 Type 2 diabetes mellitus without complications: Secondary | ICD-10-CM | POA: Diagnosis not present

## 2020-02-13 DIAGNOSIS — Z23 Encounter for immunization: Secondary | ICD-10-CM | POA: Diagnosis not present

## 2020-02-23 ENCOUNTER — Encounter: Payer: Self-pay | Admitting: Nutrition

## 2020-03-11 DIAGNOSIS — M1710 Unilateral primary osteoarthritis, unspecified knee: Secondary | ICD-10-CM | POA: Diagnosis not present

## 2020-03-11 DIAGNOSIS — Z6838 Body mass index (BMI) 38.0-38.9, adult: Secondary | ICD-10-CM | POA: Diagnosis not present

## 2020-03-11 DIAGNOSIS — Z794 Long term (current) use of insulin: Secondary | ICD-10-CM | POA: Diagnosis not present

## 2020-03-11 DIAGNOSIS — Z299 Encounter for prophylactic measures, unspecified: Secondary | ICD-10-CM | POA: Diagnosis not present

## 2020-03-11 DIAGNOSIS — E1165 Type 2 diabetes mellitus with hyperglycemia: Secondary | ICD-10-CM | POA: Diagnosis not present

## 2020-03-13 DIAGNOSIS — E119 Type 2 diabetes mellitus without complications: Secondary | ICD-10-CM | POA: Diagnosis not present

## 2020-04-13 DIAGNOSIS — E119 Type 2 diabetes mellitus without complications: Secondary | ICD-10-CM | POA: Diagnosis not present

## 2020-04-20 DIAGNOSIS — E1159 Type 2 diabetes mellitus with other circulatory complications: Secondary | ICD-10-CM | POA: Diagnosis not present

## 2020-04-20 DIAGNOSIS — E559 Vitamin D deficiency, unspecified: Secondary | ICD-10-CM | POA: Diagnosis not present

## 2020-04-21 LAB — PTH, INTACT AND CALCIUM
Calcium: 10.8 mg/dL — ABNORMAL HIGH (ref 8.7–10.3)
PTH: 62 pg/mL (ref 15–65)

## 2020-04-21 LAB — VITAMIN D 25 HYDROXY (VIT D DEFICIENCY, FRACTURES): Vit D, 25-Hydroxy: 19.1 ng/mL — ABNORMAL LOW (ref 30.0–100.0)

## 2020-04-21 LAB — PHOSPHORUS: Phosphorus: 3.3 mg/dL (ref 3.0–4.3)

## 2020-04-21 LAB — MAGNESIUM: Magnesium: 1.7 mg/dL (ref 1.6–2.3)

## 2020-04-29 ENCOUNTER — Other Ambulatory Visit: Payer: Self-pay

## 2020-04-29 ENCOUNTER — Encounter: Payer: Self-pay | Admitting: "Endocrinology

## 2020-04-29 ENCOUNTER — Encounter: Payer: Self-pay | Admitting: Nutrition

## 2020-04-29 ENCOUNTER — Ambulatory Visit (INDEPENDENT_AMBULATORY_CARE_PROVIDER_SITE_OTHER): Payer: Medicare Other | Admitting: "Endocrinology

## 2020-04-29 ENCOUNTER — Encounter: Payer: Medicare Other | Attending: Internal Medicine | Admitting: Nutrition

## 2020-04-29 VITALS — BP 130/80 | HR 68 | Ht 71.0 in | Wt 264.0 lb

## 2020-04-29 VITALS — Wt 264.0 lb

## 2020-04-29 DIAGNOSIS — I10 Essential (primary) hypertension: Secondary | ICD-10-CM | POA: Diagnosis not present

## 2020-04-29 DIAGNOSIS — E1159 Type 2 diabetes mellitus with other circulatory complications: Secondary | ICD-10-CM | POA: Insufficient documentation

## 2020-04-29 DIAGNOSIS — E559 Vitamin D deficiency, unspecified: Secondary | ICD-10-CM | POA: Diagnosis not present

## 2020-04-29 DIAGNOSIS — E782 Mixed hyperlipidemia: Secondary | ICD-10-CM | POA: Diagnosis not present

## 2020-04-29 LAB — POCT GLYCOSYLATED HEMOGLOBIN (HGB A1C): HbA1c, POC (controlled diabetic range): 9.8 % — AB (ref 0.0–7.0)

## 2020-04-29 MED ORDER — VITAMIN D (ERGOCALCIFEROL) 1.25 MG (50000 UNIT) PO CAPS: 50000.0000 [IU] | ORAL_CAPSULE | ORAL | 0 refills | Status: DC

## 2020-04-29 NOTE — Progress Notes (Signed)
°  Medical Nutrition Therapy:  Appt start time: 1030end time:  [id number]  Assessment:  Primary concerns today: DM Type 2 . Follow up. Saw Dr.  Dorris Fetch today.  She notes she need to do a better job of making time to take care of herself and get control of her health. She may cut back on some of her jobs. Eating meals on time and better choices. Cut out sweets Eating more vegetables and taking medication. FBS: 100-160's. Taking Levermeir 50 units. Metformin 1000 mg BID. Victoza daily. And takes Novolog 12 units and sliding scale with meals. Has a Libre.  Needs to work  on better sleeping schedule..   Lab Results  Component Value Date   HGBA1C 9.8 (A) 04/29/2020   CMP Latest Ref Rng & Units 04/20/2020 01/14/2020 05/01/2019  Glucose 65 - 99 mg/dL - 73 -  BUN 8 - 27 mg/dL - 18 13  Creatinine 0.57 - 1.00 mg/dL - 0.89 0.7  Sodium 134 - 144 mmol/L - 141 -  Potassium 3.5 - 5.2 mmol/L - 4.4 -  Chloride 96 - 106 mmol/L - 103 -  CO2 20 - 29 mmol/L - 24 -  Calcium 8.7 - 10.3 mg/dL 10.8(H) 11.1(H) -  Total Protein 6.0 - 8.5 g/dL - 7.2 -  Total Bilirubin 0.0 - 1.2 mg/dL - 0.4 -  Alkaline Phos 48 - 121 IU/L - 119 -  AST 0 - 40 IU/L - 16 -  ALT 0 - 32 IU/L - 21 -   Lipid Panel     Component Value Date/Time   CHOL 282 (H) 01/14/2020 0955   TRIG 222 (H) 01/14/2020 0955   HDL 41 01/14/2020 0955   CHOLHDL 6.9 (H) 01/14/2020 0955   LDLCALC 198 (H) 01/14/2020 0955   LABVLDL 43 (H) 01/14/2020 0955    Preferred Learning Style:   Auditory  Visual  Hands on     Learning Readiness:    Ready  Change in progress   MEDICATIONS:   DIETARY INTAKE: B) 1 slice toast and coffee- L) Skipped.Gwyneth Sprout a soup D)turkey and dressing and green beans.  Estimated energy needs: 1200 calories 135  g carbohydrates 90 g protein 33 g fat  Progress Towards Goal(s):  In progress.   Nutritional Diagnosis:  NB-1.1 Food and nutrition-related knowledge deficit As related to Diabetes.  As evidenced by A1C  11.4%..    Intervention:  Nutrition and Diabetes education provided on My Plate, CHO counting, meal planning, portion sizes, timing of meals, avoiding snacks between meals unless having a low blood sugar, target ranges for A1C and blood sugars, signs/symptoms and treatment of hyper/hypoglycemia, monitoring blood sugars, taking medications as prescribed, benefits of exercising 30 minutes per day and prevention of complications of DM. Marland Kitchen Goals  Eat three meals per day Take insulin as prescribed. Increase water intake  Teaching Method Utilized: Visual Auditory Hands on  Handouts given during visit include:  The Plate Method  Diabetes Instructions.  Barriers to learning/adherence to lifestyle change: none  Demonstrated degree of understanding via:  Teach Back   Monitoring/Evaluation:  Dietary intake, exercise, , and body weight in 3 month(s).

## 2020-04-29 NOTE — Patient Instructions (Signed)
Goals  Eat three meals per day Take insulin as prescribed. Increase water intake

## 2020-04-29 NOTE — Progress Notes (Signed)
04/29/2020, 2:11 PM  Endocrinology follow-up note   Subjective:    Patient ID: Laurie Palmer, female    DOB: August 18, 1945.  Laurie Palmer is being seen in follow-up after she was seen in consultation for management of currently uncontrolled symptomatic diabetes requested by  Monico Blitz, MD.   Past Medical History:  Diagnosis Date  . Colon cancer ([place])   . Elevated carcinoembryonic antigen (CEA)   . Hypercalcemia   . Mass of left thigh     Past Surgical History:  Procedure Laterality Date  . IRRIGATION AND DEBRIDEMENT ABSCESS Left 03/25/2015   Procedure: IRRIGATION AND DEBRIDEMENT ABSCESS LEFT THIGH WITH BIOPSIES AND WOUND VAC PLACEMENT;  Surgeon: Hall Busing, MD;  Location: [facility];  Service: General;  Laterality: Left;    Social History   Socioeconomic History  . Marital status: Widowed    Spouse name: Not on file  . Number of children: Not on file  . Years of education: Not on file  . Highest education level: Not on file  Occupational History  . Not on file  Tobacco Use  . Smoking status: Never Smoker  . Smokeless tobacco: Never Used  Substance and Sexual Activity  . Alcohol use: No    Alcohol/week: 0.0 standard drinks  . Drug use: No  . Sexual activity: Not on file  Other Topics Concern  . Not on file  Social History Narrative  . Not on file   Social Determinants of Health   Financial Resource Strain: Not on file  Food Insecurity: Not on file  Transportation Needs: Not on file  Physical Activity: Not on file  Stress: Not on file  Social Connections: Not on file    Family History  Problem Relation Age of Onset  . Diabetes Mother   . Hypertension Mother   . Hyperlipidemia Mother   . Heart attack Mother   . Heart failure Mother   . Prostate cancer Father   . Prostate cancer Brother     Outpatient Encounter Medications as of 04/29/2020  Medication Sig   . aspirin (ASPIRIN EC) 81 MG EC tablet Take 81 mg by mouth daily.   Marland Kitchen CINNAMON PO Take 1 tablet by mouth daily.  . Continuous Blood Gluc Sensor (FREESTYLE LIBRE 14 DAY SENSOR) MISC Inject 1 each into the skin every 14 (fourteen) days. Use as directed.  . diclofenac (VOLTAREN) 75 MG EC tablet Take 75 mg by mouth 2 (two) times daily.  . insulin aspart (NOVOLOG) 100 UNIT/ML injection Inject 12-18 Units into the skin 3 (three) times daily before meals.  . insulin detemir (LEVEMIR) 100 UNIT/ML injection Inject 60 Units into the skin at bedtime.  . liraglutide (VICTOZA) 18 MG/3ML SOPN Inject 1.8 mg into the skin daily.  . metFORMIN (GLUCOPHAGE) 1000 MG tablet Take 1,000 mg by mouth 2 (two) times daily with a meal.  . pravastatin (PRAVACHOL) 20 MG tablet Take 20 mg by mouth daily.  . Vitamin D, Ergocalciferol, (DRISDOL) 1.25 MG (50000 UNIT) CAPS capsule Take 1 capsule (50,000 Units total) by mouth every 7 (seven) days.  . [DISCONTINUED] Vitamin D,  Ergocalciferol, (DRISDOL) 1.25 MG (50000 UNIT) CAPS capsule Take 1 capsule (50,000 Units total) by mouth every 7 (seven) days.   No facility-administered encounter medications on file as of 04/29/2020.    ALLERGIES: Allergies  Allergen Reactions  . Shellfish Allergy Swelling and Anaphylaxis  . Lipitor [Atorvastatin] Nausea Only    VACCINATION STATUS: There is no immunization history for the selected administration types on file for this patient.  Diabetes She presents for her follow-up diabetic visit. She has type 2 diabetes mellitus. Onset time: She was diagnosed at approximate age of 22 years. Her disease course has been worsening. There are no hypoglycemic associated symptoms. Pertinent negatives for hypoglycemia include no confusion, headaches, pallor or seizures. Associated symptoms include fatigue, polydipsia and polyuria. Pertinent negatives for diabetes include no chest pain and no polyphagia. There are no hypoglycemic complications. Symptoms  are improving. There are no diabetic complications. Risk factors for coronary artery disease include dyslipidemia, diabetes mellitus, obesity, sedentary lifestyle and post-menopausal. Current diabetic treatments: She is currently on Levemir 60-65 units nightly, NovoLog 10-16 units 3 times daily, Victoza 1.2 mg daily, Metformin 100 mg p.o. twice daily. Her weight is fluctuating minimally. She is following a generally unhealthy diet. When asked about meal planning, she reported none. She has not had a previous visit with a dietitian. She rarely participates in exercise. Her home blood glucose trend is increasing steadily. Her breakfast blood glucose range is generally 180-200 mg/dl. Her lunch blood glucose range is generally 180-200 mg/dl. Her dinner blood glucose range is generally 180-200 mg/dl. Her bedtime blood glucose range is generally 180-200 mg/dl. Her overall blood glucose range is 180-200 mg/dl. (She presents with her CGM device which shows 35% time range, 59% above range, 3% hypoglycemia.  She made a dosing error on her NovoLog, used it only if blood sugar readings are above 150 mg per DL.  Her point-of-care A1c is unchanged at 9.8%.   )  Hyperlipidemia This is a chronic problem. The current episode started more than 1 year ago. The problem is uncontrolled. Exacerbating diseases include diabetes and obesity. Pertinent negatives include no chest pain, myalgias or shortness of breath. Current antihyperlipidemic treatment includes statins. Risk factors for coronary artery disease include dyslipidemia, diabetes mellitus, family history, obesity, hypertension, a sedentary lifestyle and post-menopausal.    Review of Systems  Constitutional: Positive for fatigue. Negative for chills, fever and unexpected weight change.  HENT: Negative for trouble swallowing and voice change.   Eyes: Negative for visual disturbance.  Respiratory: Negative for cough, shortness of breath and wheezing.   Cardiovascular:  Negative for chest pain, palpitations and leg swelling.  Gastrointestinal: Negative for diarrhea, nausea and vomiting.  Endocrine: Positive for polydipsia and polyuria. Negative for cold intolerance, heat intolerance and polyphagia.  Musculoskeletal: Negative for arthralgias and myalgias.  Skin: Negative for color change, pallor, rash and wound.  Neurological: Negative for seizures and headaches.  Psychiatric/Behavioral: Negative for confusion and suicidal ideas.    Objective:    Vitals with BMI 04/29/2020 04/29/2020 01/27/2020  Height - 5\' 11"  5\' 11"   Weight 264 lbs 264 lbs 261 lbs 13 oz  BMI 36.84 35.36 14.43  Systolic - 154 008  Diastolic - 80 76  Pulse - 68 80    BP 130/80   Pulse 68   Ht 5\' 11"  (1.803 m)   Wt 264 lb (119.7 kg)   BMI 36.82 kg/m   Wt Readings from Last 3 Encounters:  04/29/20 264 lb (119.7 kg)  04/29/20 264 lb (  119.7 kg)  01/27/20 261 lb 12.8 oz (118.8 kg)     Physical Exam Constitutional:      Appearance: She is well-developed.  HENT:     Head: Normocephalic and atraumatic.  Neck:     Thyroid: No thyromegaly.     Trachea: No tracheal deviation.  Cardiovascular:     Rate and Rhythm: Normal rate and regular rhythm.  Pulmonary:     Effort: Pulmonary effort is normal.  Abdominal:     Tenderness: There is no abdominal tenderness. There is no guarding.  Musculoskeletal:        General: Normal range of motion.     Cervical back: Normal range of motion and neck supple.     Comments: Normal foot exam today.  Skin:    General: Skin is warm and dry.     Coloration: Skin is not pale.     Findings: No erythema or rash.  Neurological:     Mental Status: She is alert and oriented to person, place, and time.     Cranial Nerves: No cranial nerve deficit.     Coordination: Coordination normal.     Deep Tendon Reflexes: Reflexes are normal and symmetric.  Psychiatric:        Judgment: Judgment normal.      CMP ( most recent) CMP     Component Value  Date/Time   NA 141 01/14/2020 0955   K 4.4 01/14/2020 0955   CL 103 01/14/2020 0955   CO2 24 01/14/2020 0955   GLUCOSE 73 01/14/2020 0955   GLUCOSE 135 (H) 03/26/2015 0540   BUN 18 01/14/2020 0955   CREATININE 0.89 01/14/2020 0955   CALCIUM 10.8 (H) 04/20/2020 0921   PROT 7.2 01/14/2020 0955   ALBUMIN 4.2 01/14/2020 0955   AST 16 01/14/2020 0955   ALT 21 01/14/2020 0955   ALKPHOS 119 01/14/2020 0955   BILITOT 0.4 01/14/2020 0955   GFRNONAA 64 01/14/2020 0955   GFRAA 74 01/14/2020 0955     Diabetic Labs (most recent): Lab Results  Component Value Date   HGBA1C 9.8 (A) 04/29/2020   HGBA1C 9.6 (A) 01/27/2020   HGBA1C 11.7 10/06/2019    CMP Latest Ref Rng & Units 04/20/2020 01/14/2020 05/01/2019  Glucose 65 - 99 mg/dL - 73 -  BUN 8 - 27 mg/dL - 18 13  Creatinine 0.57 - 1.00 mg/dL - 0.89 0.7  Sodium 134 - 144 mmol/L - 141 -  Potassium 3.5 - 5.2 mmol/L - 4.4 -  Chloride 96 - 106 mmol/L - 103 -  CO2 20 - 29 mmol/L - 24 -  Calcium 8.7 - 10.3 mg/dL 10.8(H) 11.1(H) -  Total Protein 6.0 - 8.5 g/dL - 7.2 -  Total Bilirubin 0.0 - 1.2 mg/dL - 0.4 -  Alkaline Phos 48 - 121 IU/L - 119 -  AST 0 - 40 IU/L - 16 -  ALT 0 - 32 IU/L - 21 -   Lipid Panel     Component Value Date/Time   CHOL 282 (H) 01/14/2020 0955   TRIG 222 (H) 01/14/2020 0955   HDL 41 01/14/2020 0955   CHOLHDL 6.9 (H) 01/14/2020 0955   LDLCALC 198 (H) 01/14/2020 0955   LABVLDL 43 (H) 01/14/2020 0955      Assessment & Plan:   1. DM type 2 causing vascular disease ([place])   - Laurie Palmer has currently uncontrolled symptomatic type 2 DM since  74 years of age.  She presents with her CGM device which  shows 35% time range, 59% above range, 3% hypoglycemia.  She made a dosing error on her NovoLog, used it only if blood sugar readings are above 150 mg per DL.  She avoided using NovoLog when Premeal blood glucose readings are between 90-150.  Her point-of-care A1c is unchanged at 9.8%.    - I had a long  discussion with her about the progressive nature of diabetes and the pathology behind its complications. -her diabetes is complicated by obesity/sedentary life and she remains at a high risk for more acute and chronic complications which include CAD, CVA, CKD, retinopathy, and neuropathy. These are all discussed in detail with her.  - I have counseled her on diet  and weight management  by adopting a carbohydrate restricted/protein rich diet. Patient is encouraged to switch to  unprocessed or minimally processed     complex starch and increased protein intake (animal or plant source), fruits, and vegetables. -  she is advised to stick to a routine mealtimes to eat 3 meals  a day and avoid unnecessary snacks ( to snack only to correct hypoglycemia).   - she acknowledges that there is a room for improvement in her food and drink choices. - Suggestion is made for her to avoid simple carbohydrates  from her diet including Cakes, Sweet Desserts, Ice Cream, Soda (diet and regular), Sweet Tea, Candies, Chips, Cookies, Store Bought Juices, Alcohol in Excess of  1-2 drinks a day, Artificial Sweeteners,  Coffee Creamer, and "Sugar-free" Products, Lemonade. This will help patient to have more stable blood glucose profile and potentially avoid unintended weight gain.   - she will be scheduled with Jearld Fenton, RDN, CDE for diabetes education.  - I have approached her with the following individualized plan to manage  her diabetes and patient agrees:   - she will continue to need intensive treatment with basal/bolus insulin in order for her to achieve and maintain control of diabetes to target.   -Based on her presentation with near target glycemic profile, she is advised to continue Levemir 50 units nightly, advised to continue NovoLog Units 3 times a day with meals  for pre-meal BG readings of 90-150mg /dl, plus patient specific correction dose for unexpected hyperglycemia above 150mg /dl, associated with strict  monitoring of glucose 4 times a day-before meals and at bedtime. -Proper insulin dose and administration is reviewed once again with her. -She has received the freestyle libre CGM device, advised to use it at all times.    - she is warned not to take insulin without proper monitoring per orders. - Adjustment parameters are given to her for hypo and hyperglycemia in writing. - she is encouraged to call clinic for blood glucose levels less than 70 or above 300 mg /dl. - she is advised to continue Metformin 1000 mg p.o. twice daily, Victoza 1.8 mg subcutaneously daily.    - Specific targets for  A1c;  LDL, HDL,  and Triglycerides were discussed with the patient.  2) Blood Pressure /Hypertension: Her blood pressure is controlled to target. she is advised to bring all of her medications, currently on not on ACE inhibitor/ARB.    3) Lipids/Hyperlipidemia:   Review of her recent lipid panel showed uncontrolled  LDL at 211 .  Admittedly, she has not been taking her Pravachol, LDL uncontrolled at 198.  She is advised to resume taking her pravastatin 20 mg p.o. nightly.   Side effects and precautions discussed with her.  4)  Weight/Diet:  Body mass index is 36.82  kg/m.  -   clearly complicating her diabetes care.   she is  a candidate for weight loss. I discussed with her the fact that loss of 5 - 10% of her  current body weight will have the most impact on her diabetes management.  Exercise, and detailed carbohydrates information provided  -  detailed on discharge instructions.  5) vitamin D deficiency: Recently 11.7 She is advised to continue ergocalciferol 50,000 units weekly for 12 weeks. Her labs show mild hypercalcemia of 10.8, improving from 11.1.  This was associated with high normal PTH of 62, still possible to be early mild primary hyperparathyroidism.  She will continue to need follow-up with PTH/calcium and 24-hour urine calcium study on subsequent visits.  6) Chronic Care/Health  Maintenance:  -she  Is non Statin medications and  is encouraged to initiate and continue to follow up with Ophthalmology, Dentist,  Podiatrist at least yearly or according to recommendations, and advised to   stay away from smoking. I have recommended yearly flu vaccine and pneumonia vaccine at least every 5 years; moderate intensity exercise for up to 150 minutes weekly; and  sleep for at least 7 hours a day.    - she is  advised to maintain close follow up with Monico Blitz, MD for primary care needs, as well as her other providers for optimal and coordinated care.  - Time spent on this patient care encounter:  40 min, of which > 50% was spent in  counseling and the rest reviewing her blood glucose logs , discussing her hypoglycemia and hyperglycemia episodes, reviewing her current and  previous labs / studies  ( including abstraction from other facilities) and medications  doses and developing a  long term treatment plan and documenting her care.   Please refer to Patient Instructions for Blood Glucose Monitoring and Insulin/Medications Dosing Guide"  in media tab for additional information. Please  also refer to " Patient Self Inventory" in the Media  tab for reviewed elements of pertinent patient history.  Laurie Palmer participated in the discussions, expressed understanding, and voiced agreement with the above plans.  All questions were answered to her satisfaction. she is encouraged to contact clinic should she have any questions or concerns prior to her return visit.   Follow up plan: - Return in about 3 months (around 07/28/2020) for F/U with Pre-visit Labs, Meter, Logs, A1c here.Glade Lloyd, MD [facility] [address] [place], [place] [address] Phone: [phone]  Fax: [phone]    04/29/2020, 2:11 PM  This note was partially dictated with voice recognition software. Similar sounding words can be transcribed  inadequately or may not  be corrected upon review.

## 2020-04-29 NOTE — Patient Instructions (Signed)

## 2020-05-04 ENCOUNTER — Ambulatory Visit (HOSPITAL_COMMUNITY): Payer: Medicare Other

## 2020-05-04 ENCOUNTER — Other Ambulatory Visit: Payer: Self-pay | Admitting: Internal Medicine

## 2020-05-04 ENCOUNTER — Other Ambulatory Visit (HOSPITAL_COMMUNITY): Payer: Self-pay | Admitting: Internal Medicine

## 2020-05-04 DIAGNOSIS — M79605 Pain in left leg: Secondary | ICD-10-CM

## 2020-05-04 DIAGNOSIS — R5383 Other fatigue: Secondary | ICD-10-CM | POA: Diagnosis not present

## 2020-05-04 DIAGNOSIS — E1165 Type 2 diabetes mellitus with hyperglycemia: Secondary | ICD-10-CM | POA: Diagnosis not present

## 2020-05-04 DIAGNOSIS — Z0000 Encounter for general adult medical examination without abnormal findings: Secondary | ICD-10-CM | POA: Diagnosis not present

## 2020-05-04 DIAGNOSIS — E559 Vitamin D deficiency, unspecified: Secondary | ICD-10-CM | POA: Diagnosis not present

## 2020-05-04 DIAGNOSIS — E7800 Pure hypercholesterolemia, unspecified: Secondary | ICD-10-CM | POA: Diagnosis not present

## 2020-05-04 DIAGNOSIS — Z1339 Encounter for screening examination for other mental health and behavioral disorders: Secondary | ICD-10-CM | POA: Diagnosis not present

## 2020-05-04 DIAGNOSIS — Z6838 Body mass index (BMI) 38.0-38.9, adult: Secondary | ICD-10-CM | POA: Diagnosis not present

## 2020-05-04 DIAGNOSIS — Z7189 Other specified counseling: Secondary | ICD-10-CM | POA: Diagnosis not present

## 2020-05-04 DIAGNOSIS — Z1331 Encounter for screening for depression: Secondary | ICD-10-CM | POA: Diagnosis not present

## 2020-05-04 DIAGNOSIS — Z299 Encounter for prophylactic measures, unspecified: Secondary | ICD-10-CM | POA: Diagnosis not present

## 2020-05-05 ENCOUNTER — Other Ambulatory Visit: Payer: Self-pay

## 2020-05-05 ENCOUNTER — Ambulatory Visit (HOSPITAL_COMMUNITY)
Admission: RE | Admit: 2020-05-05 | Discharge: 2020-05-05 | Disposition: A | Discharge status: Home / Self Care | Payer: Medicare Other | Source: Ambulatory Visit | Attending: Internal Medicine | Admitting: Internal Medicine

## 2020-05-05 DIAGNOSIS — M79662 Pain in left lower leg: Secondary | ICD-10-CM | POA: Diagnosis not present

## 2020-05-05 DIAGNOSIS — M79605 Pain in left leg: Secondary | ICD-10-CM

## 2020-05-13 DIAGNOSIS — E7800 Pure hypercholesterolemia, unspecified: Secondary | ICD-10-CM | POA: Diagnosis not present

## 2020-05-13 DIAGNOSIS — E119 Type 2 diabetes mellitus without complications: Secondary | ICD-10-CM | POA: Diagnosis not present

## 2020-07-28 ENCOUNTER — Ambulatory Visit: Payer: Medicare Other | Admitting: Nutrition

## 2020-07-28 ENCOUNTER — Ambulatory Visit: Payer: Medicare Other | Admitting: "Endocrinology

## 2020-08-05 LAB — HEMOGLOBIN A1C: Hemoglobin A1C: 7.8

## 2020-08-06 LAB — COMPREHENSIVE METABOLIC PANEL
ALT: 15 IU/L (ref 0–32)
AST: 16 IU/L (ref 0–40)
Albumin/Globulin Ratio: 1.3 (ref 1.2–2.2)
Albumin: 4.3 g/dL (ref 3.7–4.7)
Alkaline Phosphatase: 94 IU/L (ref 44–121)
BUN/Creatinine Ratio: 13 (ref 12–28)
BUN: 10 mg/dL (ref 8–27)
Bilirubin Total: 0.6 mg/dL (ref 0.0–1.2)
CO2: 24 mmol/L (ref 20–29)
Calcium: 10.9 mg/dL — ABNORMAL HIGH (ref 8.7–10.3)
Chloride: 100 mmol/L (ref 96–106)
Creatinine, Ser: 0.79 mg/dL (ref 0.57–1.00)
Globulin, Total: 3.2 g/dL (ref 1.5–4.5)
Glucose: 142 mg/dL — ABNORMAL HIGH (ref 65–99)
Potassium: 4.7 mmol/L (ref 3.5–5.2)
Sodium: 141 mmol/L (ref 134–144)
Total Protein: 7.5 g/dL (ref 6.0–8.5)
eGFR: 78 mL/min/{1.73_m2} (ref >59)

## 2020-08-06 LAB — PTH, INTACT AND CALCIUM: PTH: 58 pg/mL (ref 15–65)

## 2020-08-11 ENCOUNTER — Other Ambulatory Visit: Payer: Self-pay

## 2020-08-11 ENCOUNTER — Ambulatory Visit (INDEPENDENT_AMBULATORY_CARE_PROVIDER_SITE_OTHER): Payer: Medicare Other | Admitting: "Endocrinology

## 2020-08-11 ENCOUNTER — Encounter: Payer: Self-pay | Admitting: "Endocrinology

## 2020-08-11 VITALS — BP 152/97 | HR 82 | Ht 71.0 in | Wt 256.0 lb

## 2020-08-11 DIAGNOSIS — I10 Essential (primary) hypertension: Secondary | ICD-10-CM | POA: Diagnosis not present

## 2020-08-11 DIAGNOSIS — E1159 Type 2 diabetes mellitus with other circulatory complications: Secondary | ICD-10-CM

## 2020-08-11 DIAGNOSIS — E559 Vitamin D deficiency, unspecified: Secondary | ICD-10-CM

## 2020-08-11 MED ORDER — CHOLECALCIFEROL 50 MCG (2000 UT) PO CAPS: 1.0000 | ORAL_CAPSULE | Freq: Every day | ORAL | 2 refills | Status: AC

## 2020-08-11 NOTE — Patient Instructions (Signed)
                                     Advice for Weight Management  -For most of us the best way to lose weight is by diet management. Generally speaking, diet management means consuming less calories intentionally which over time brings about progressive weight loss.  This can be achieved more effectively by restricting carbohydrate consumption to the minimum possible.  So, it is critically important to know your numbers: how much calorie you are consuming and how much calorie you need. More importantly, our carbohydrates sources should be unprocessed or minimally processed complex starch food items.   Sometimes, it is important to balance nutrition by increasing protein intake (animal or plant source), fruits, and vegetables.  -Sticking to a routine mealtime to eat 3 meals a day and avoiding unnecessary snacks is shown to have a big role in weight control. Under normal circumstances, the only time we lose real weight is when we are hungry, so allow hunger to take place- hunger means no food between meal times, only water.  It is not advisable to starve.   -It is better to avoid simple carbohydrates including: Cakes, Sweet Desserts, Ice Cream, Soda (diet and regular), Sweet Tea, Candies, Chips, Cookies, Store Bought Juices, Alcohol in Excess of  1-2 drinks a day, Lemonade,  Artificial Sweeteners, Doughnuts, Coffee Creamers, "Sugar-free" Products, etc, etc.  This is not a complete list.....    -Consulting with certified diabetes educators is proven to provide you with the most accurate and current information on diet.  Also, you may be  interested in discussing diet options/exchanges , we can schedule a visit with Laurie Palmer, RDN, CDE for individualized nutrition education.  -Exercise: If you are able: 30 -60 minutes a day ,4 days a week, or 150 minutes a week.  The longer the better.  Combine stretch, strength, and aerobic activities.  If you were told in the  past that you have high risk for cardiovascular diseases, you may seek evaluation by your heart doctor prior to initiating moderate to intense exercise programs.                                  Additional Care Considerations for Diabetes   -Diabetes  is a chronic disease.  The most important care consideration is regular follow-up with your diabetes care provider with the goal being avoiding or delaying its complications and to take advantage of advances in medications and technology.    -Type 2 diabetes is known to coexist with other important comorbidities such as high blood pressure and high cholesterol.  It is critical to control not only the diabetes but also the high blood pressure and high cholesterol to minimize and delay the risk of complications including coronary artery disease, stroke, amputations, blindness, etc.    - Studies showed that people with diabetes will benefit from a class of medications known as ACE inhibitors and statins.  Unless there are specific reasons not to be on these medications, the standard of care is to consider getting one from these groups of medications at an optimal doses.  These medications are generally considered safe and proven to help protect the heart and the kidneys.    - People with diabetes are encouraged to initiate and maintain regular follow-up with eye doctors, foot   doctors, dentists , and if necessary heart and kidney doctors.     - It is highly recommended that people with diabetes quit smoking or stay away from smoking, and get yearly  flu vaccine and pneumonia vaccine at least every 5 years.  One other important lifestyle recommendation is to ensure adequate sleep - at least 6-7 hours of uninterrupted sleep at night.  -Exercise: If you are able: 30 -60 minutes a day, 4 days a week, or 150 minutes a week.  The longer the better.  Combine stretch, strength, and aerobic activities.  If you were told in the past that you have high risk for  cardiovascular diseases, you may seek evaluation by your heart doctor prior to initiating moderate to intense exercise programs.          

## 2020-08-11 NOTE — Progress Notes (Signed)
08/11/2020, 12:51 PM  Endocrinology follow-up note   Subjective:    Patient ID: Laurie Palmer, female    DOB: [birth year].  Laurie Palmer is being seen in follow-up after she was seen in consultation for management of currently uncontrolled symptomatic diabetes requested by  Monico Blitz, MD.   Past Medical History:  Diagnosis Date  . Colon cancer ([place])   . Elevated carcinoembryonic antigen (CEA)   . Hypercalcemia   . Mass of left thigh     Past Surgical History:  Procedure Laterality Date  . IRRIGATION AND DEBRIDEMENT ABSCESS Left 03/25/2015   Procedure: IRRIGATION AND DEBRIDEMENT ABSCESS LEFT THIGH WITH BIOPSIES AND WOUND VAC PLACEMENT;  Surgeon: Hall Busing, MD;  Location: [facility];  Service: General;  Laterality: Left;    Social History   Socioeconomic History  . Marital status: Widowed    Spouse name: Not on file  . Number of children: Not on file  . Years of education: Not on file  . Highest education level: Not on file  Occupational History  . Not on file  Tobacco Use  . Smoking status: Never Smoker  . Smokeless tobacco: Never Used  Vaping Use  . Vaping Use: Never used  Substance and Sexual Activity  . Alcohol use: No    Alcohol/week: 0.0 standard drinks  . Drug use: No  . Sexual activity: Not on file  Other Topics Concern  . Not on file  Social History Narrative  . Not on file   Social Determinants of Health   Financial Resource Strain: Not on file  Food Insecurity: Not on file  Transportation Needs: Not on file  Physical Activity: Not on file  Stress: Not on file  Social Connections: Not on file    Family History  Problem Relation Age of Onset  . Diabetes Mother   . Hypertension Mother   . Hyperlipidemia Mother   . Heart attack Mother   . Heart failure Mother   . Prostate cancer Father   . Prostate cancer Brother     Outpatient Encounter  Medications as of 08/11/2020  Medication Sig  . Cholecalciferol 50 MCG (2000 UT) CAPS Take 1 capsule (2,000 Units total) by mouth daily with breakfast.  . aspirin (ASPIRIN EC) 81 MG EC tablet Take 81 mg by mouth daily.   . cefUROXime (CEFTIN) 250 MG tablet Take 250 mg by mouth 2 (two) times daily.  Marland Kitchen CINNAMON PO Take 1 tablet by mouth daily.  . Continuous Blood Gluc Sensor (FREESTYLE LIBRE 14 DAY SENSOR) MISC Inject 1 each into the skin every 14 (fourteen) days. Use as directed.  . diclofenac (VOLTAREN) 75 MG EC tablet Take 75 mg by mouth 2 (two) times daily.  . insulin aspart (NOVOLOG) 100 UNIT/ML injection Inject 12-18 Units into the skin 3 (three) times daily before meals.  . insulin detemir (LEVEMIR) 100 UNIT/ML injection Inject 60 Units into the skin at bedtime.  . liraglutide (VICTOZA) 18 MG/3ML SOPN Inject 1.8 mg into the skin daily.  . metFORMIN (GLUCOPHAGE) 1000 MG tablet Take 1,000 mg by mouth 2 (two) times daily with  a meal.  . pravastatin (PRAVACHOL) 20 MG tablet Take 20 mg by mouth daily.  . [DISCONTINUED] Vitamin D, Ergocalciferol, (DRISDOL) 1.25 MG (50000 UNIT) CAPS capsule Take 1 capsule (50,000 Units total) by mouth every 7 (seven) days.   No facility-administered encounter medications on file as of 08/11/2020.    ALLERGIES: Allergies  Allergen Reactions  . Shellfish Allergy Swelling and Anaphylaxis  . Lipitor [Atorvastatin] Nausea Only    VACCINATION STATUS: There is no immunization history for the selected administration types on file for this patient.  Diabetes She presents for her follow-up diabetic visit. She has type 2 diabetes mellitus. Onset time: She was diagnosed at approximate age of 42 years. Her disease course has been improving. There are no hypoglycemic associated symptoms. Pertinent negatives for hypoglycemia include no confusion, headaches, pallor or seizures. Associated symptoms include fatigue, polydipsia and polyuria. Pertinent negatives for diabetes  include no chest pain and no polyphagia. There are no hypoglycemic complications. Symptoms are improving. There are no diabetic complications. Risk factors for coronary artery disease include dyslipidemia, diabetes mellitus, obesity, sedentary lifestyle and post-menopausal. Her weight is fluctuating minimally. She is following a generally unhealthy diet. When asked about meal planning, she reported none. She has not had a previous visit with a dietitian. She rarely participates in exercise. Her home blood glucose trend is decreasing steadily. Her breakfast blood glucose range is generally 140-180 mg/dl. Her lunch blood glucose range is generally 140-180 mg/dl. Her dinner blood glucose range is generally 140-180 mg/dl. Her bedtime blood glucose range is generally 140-180 mg/dl. Her overall blood glucose range is 140-180 mg/dl. (She presents with her CGM device showing 69% time in range, 30% slightly above range.  No hypoglycemia.  Her recent previsit labs show A1c of 7.8%, improving from 9.8%.   Average blood glucose from a CGM is 168. )  Hyperlipidemia This is a chronic problem. The current episode started more than 1 year ago. The problem is uncontrolled. Exacerbating diseases include diabetes and obesity. Pertinent negatives include no chest pain, myalgias or shortness of breath. Current antihyperlipidemic treatment includes statins. Risk factors for coronary artery disease include dyslipidemia, diabetes mellitus, family history, obesity, hypertension, a sedentary lifestyle and post-menopausal.    Review of Systems  Constitutional: Positive for fatigue. Negative for chills, fever and unexpected weight change.  HENT: Negative for trouble swallowing and voice change.   Eyes: Negative for visual disturbance.  Respiratory: Negative for cough, shortness of breath and wheezing.   Cardiovascular: Negative for chest pain, palpitations and leg swelling.  Gastrointestinal: Negative for diarrhea, nausea and  vomiting.  Endocrine: Positive for polydipsia and polyuria. Negative for cold intolerance, heat intolerance and polyphagia.  Musculoskeletal: Negative for arthralgias and myalgias.  Skin: Negative for color change, pallor, rash and wound.  Neurological: Negative for seizures and headaches.  Psychiatric/Behavioral: Negative for confusion and suicidal ideas.    Objective:    Vitals with BMI 08/11/2020 04/29/2020 04/29/2020  Height 5\' 11"  - 5\' 11"   Weight 256 lbs 264 lbs 264 lbs  BMI 35.72 69.67 89.38  Systolic 101 - 751  Diastolic 97 - 80  Pulse 82 - 68    BP (!) 152/97   Pulse 82   Ht 5\' 11"  (1.803 m)   Wt 256 lb (116.1 kg)   BMI 35.70 kg/m   Wt Readings from Last 3 Encounters:  08/11/20 256 lb (116.1 kg)  04/29/20 264 lb (119.7 kg)  04/29/20 264 lb (119.7 kg)     Physical Exam Constitutional:  Appearance: She is well-developed.  HENT:     Head: Normocephalic and atraumatic.  Neck:     Thyroid: No thyromegaly.     Trachea: No tracheal deviation.  Cardiovascular:     Rate and Rhythm: Normal rate and regular rhythm.  Pulmonary:     Effort: Pulmonary effort is normal.  Abdominal:     Tenderness: There is no abdominal tenderness. There is no guarding.  Musculoskeletal:        General: Normal range of motion.     Cervical back: Normal range of motion and neck supple.     Comments: Normal foot exam today.  Skin:    General: Skin is warm and dry.     Coloration: Skin is not pale.     Findings: No erythema or rash.  Neurological:     Mental Status: She is alert and oriented to person, place, and time.     Cranial Nerves: No cranial nerve deficit.     Coordination: Coordination normal.     Deep Tendon Reflexes: Reflexes are normal and symmetric.  Psychiatric:        Judgment: Judgment normal.      CMP ( most recent) CMP     Component Value Date/Time   NA 141 08/05/2020 0912   K 4.7 08/05/2020 0912   CL 100 08/05/2020 0912   CO2 24 08/05/2020 0912    GLUCOSE 142 (H) 08/05/2020 0912   GLUCOSE 135 (H) 03/26/2015 0540   BUN 10 08/05/2020 0912   CREATININE 0.79 08/05/2020 0912   CALCIUM 10.9 (H) 08/05/2020 0912   PROT 7.5 08/05/2020 0912   ALBUMIN 4.3 08/05/2020 0912   AST 16 08/05/2020 0912   ALT 15 08/05/2020 0912   ALKPHOS 94 08/05/2020 0912   BILITOT 0.6 08/05/2020 0912   GFRNONAA 64 01/14/2020 0955   GFRAA 74 01/14/2020 0955     Diabetic Labs (most recent): Lab Results  Component Value Date   HGBA1C 7.8 08/05/2020   HGBA1C 9.8 (A) 04/29/2020   HGBA1C 9.6 (A) 01/27/2020    CMP Latest Ref Rng & Units 08/05/2020 04/20/2020 01/14/2020  Glucose 65 - 99 mg/dL 142(H) - 73  BUN 8 - 27 mg/dL 10 - 18  Creatinine 0.57 - 1.00 mg/dL 0.79 - 0.89  Sodium 134 - 144 mmol/L 141 - 141  Potassium 3.5 - 5.2 mmol/L 4.7 - 4.4  Chloride 96 - 106 mmol/L 100 - 103  CO2 20 - 29 mmol/L 24 - 24  Calcium 8.7 - 10.3 mg/dL 10.9(H) 10.8(H) 11.1(H)  Total Protein 6.0 - 8.5 g/dL 7.5 - 7.2  Total Bilirubin 0.0 - 1.2 mg/dL 0.6 - 0.4  Alkaline Phos 44 - 121 IU/L 94 - 119  AST 0 - 40 IU/L 16 - 16  ALT 0 - 32 IU/L 15 - 21   Lipid Panel     Component Value Date/Time   CHOL 282 (H) 01/14/2020 0955   TRIG 222 (H) 01/14/2020 0955   HDL 41 01/14/2020 0955   CHOLHDL 6.9 (H) 01/14/2020 0955   LDLCALC 198 (H) 01/14/2020 0955   LABVLDL 43 (H) 01/14/2020 0955      Assessment & Plan:   1. DM type 2 causing vascular disease ([place])   - Laurie Palmer has currently uncontrolled symptomatic type 2 DM since  75 years of age.  She presents with her CGM device showing 69% time in range, 30% slightly above range.  No hypoglycemia.  Her recent previsit labs show A1c of 7.8%, improving  from 9.8%.   Average blood glucose from a CGM is 168.  - I had a long discussion with her about the progressive nature of diabetes and the pathology behind its complications. -her diabetes is complicated by obesity/sedentary life and she remains at a high risk for more acute and  chronic complications which include CAD, CVA, CKD, retinopathy, and neuropathy. These are all discussed in detail with her.  - I have counseled her on diet  and weight management  by adopting a carbohydrate restricted/protein rich diet. Patient is encouraged to switch to  unprocessed or minimally processed     complex starch and increased protein intake (animal or plant source), fruits, and vegetables. -  she is advised to stick to a routine mealtimes to eat 3 meals  a day and avoid unnecessary snacks ( to snack only to correct hypoglycemia).   - she acknowledges that there is a room for improvement in her food and drink choices. - Suggestion is made for her to avoid simple carbohydrates  from her diet including Cakes, Sweet Desserts, Ice Cream, Soda (diet and regular), Sweet Tea, Candies, Chips, Cookies, Store Bought Juices, Alcohol in Excess of  1-2 drinks a day, Artificial Sweeteners,  Coffee Creamer, and "Sugar-free" Products, Lemonade. This will help patient to have more stable blood glucose profile and potentially avoid unintended weight gain.   - she will be scheduled with Jearld Fenton, RDN, CDE for diabetes education.  - I have approached her with the following individualized plan to manage  her diabetes and patient agrees:   - she will continue to need intensive treatment with basal/bolus insulin in order for her to achieve and maintain control of diabetes to target.   -Based on her presentation with near target glycemic profile, she will continue to need intensive treatment with basal/bolus insulin in order for her to maintain control of diabetes to target.   -She is advised to continue Levemir 50 units nightly, continue NovoLog 12 units 3 times a day with meals  for pre-meal BG readings of 90-150mg /dl, plus patient specific correction dose for unexpected hyperglycemia above 150mg /dl, associated with strict monitoring of glucose 4 times a day-before meals and at bedtime. -Proper insulin  dose and administration is reviewed once again with her. -She has received the freestyle libre CGM device, advised to use it at all times.    - she is warned not to take insulin without proper monitoring per orders. - Adjustment parameters are given to her for hypo and hyperglycemia in writing. - she is encouraged to call clinic for blood glucose levels less than 70 or above 200 mg /dl. - she is advised to continue Metformin 1000 mg p.o. twice daily, Victoza 1.8 mg subcutaneously daily.    - Specific targets for  A1c;  LDL, HDL,  and Triglycerides were discussed with the patient.  2) Blood Pressure /Hypertension: Her blood pressure is controlled to target.  She reports that the first time she ever had elevated blood pressure.  She is advised to keep monitoring, will be considered for low-dose ACE inhibitor if blood pressure remains above 130/80 next visit.    3) Lipids/Hyperlipidemia:   Review of her recent lipid panel showed uncontrolled  LDL at 211 .  She reports better consistency and compliance with her pravastatin 20 mg p.o. nightly.  She is advised to continue.   Side effects and precautions discussed with her.  4)  Weight/Diet:  Body mass index is 35.7 kg/m.  -   clearly  complicating her diabetes care.   she is  a candidate for weight loss. I discussed with her the fact that loss of 5 - 10% of her  current body weight will have the most impact on her diabetes management.  Exercise, and detailed carbohydrates information provided  -  detailed on discharge instructions.  5) vitamin D deficiency:  She is currently on ongoing ergocalciferol 50,000 units weekly for 12 weeks. -After she finished her high-dose supplement, she will continue vitamin D3 2000 units daily for maintenance. Her labs show persistent mild hypercalcemia of 10.9, however improving PTH of 58.  This is still possibly due to early mild primary hyperparathyroidism.    She will continue to need follow-up with PTH/calcium and  24-hour urine calcium study on subsequent visits, after her vitamin D is corrected.  6) Chronic Care/Health Maintenance:  -she  Is non Statin medications and  is encouraged to initiate and continue to follow up with Ophthalmology, Dentist,  Podiatrist at least yearly or according to recommendations, and advised to   stay away from smoking. I have recommended yearly flu vaccine and pneumonia vaccine at least every 5 years; moderate intensity exercise for up to 150 minutes weekly; and  sleep for at least 7 hours a day.   POCT ABI Results 08/11/20  Her screening ABI is negative for PAD today August 11, 2020.  This study will be repeated in March 2027, or sooner if needed. Right ABI: 1.2      left ABI: 1.16  Right leg systolic / diastolic: 921/19 mmHg Left leg systolic / diastolic: 417/40 mmHg  Arm systolic / diastolic: 814/48 mmHG  - she is  advised to maintain close follow up with Monico Blitz, MD for primary care needs, as well as her other providers for optimal and coordinated care.  - Time spent on this patient care encounter:  45 min, of which > 50% was spent in  counseling and the rest reviewing her blood glucose logs , discussing her hypoglycemia and hyperglycemia episodes, reviewing her current and  previous labs / studies  ( including abstraction from other facilities) and medications  doses and developing a  long term treatment plan and documenting her care.   Please refer to Patient Instructions for Blood Glucose Monitoring and Insulin/Medications Dosing Guide"  in media tab for additional information. Please  also refer to " Patient Self Inventory" in the Media  tab for reviewed elements of pertinent patient history.  Laurie Palmer participated in the discussions, expressed understanding, and voiced agreement with the above plans.  All questions were answered to her satisfaction. she is encouraged to contact clinic should she have any questions or concerns prior to her return  visit.    Follow up plan: - Return in about 19 weeks (around 12/22/2020) for Bring Meter and Logs- A1c in Office.  Glade Lloyd, MD [facility] [address] [place],  [address] Phone: [phone]  Fax: [phone]    08/11/2020, 12:51 PM  This note was partially dictated with voice recognition software. Similar sounding words can be transcribed inadequately or may not  be corrected upon review.

## 2020-12-22 ENCOUNTER — Encounter: Payer: Self-pay | Admitting: "Endocrinology

## 2020-12-22 ENCOUNTER — Ambulatory Visit (INDEPENDENT_AMBULATORY_CARE_PROVIDER_SITE_OTHER): Payer: Medicare Other | Admitting: "Endocrinology

## 2020-12-22 ENCOUNTER — Other Ambulatory Visit: Payer: Self-pay

## 2020-12-22 VITALS — BP 130/76 | HR 80 | Ht 71.0 in | Wt 269.8 lb

## 2020-12-22 DIAGNOSIS — E782 Mixed hyperlipidemia: Secondary | ICD-10-CM

## 2020-12-22 DIAGNOSIS — I10 Essential (primary) hypertension: Secondary | ICD-10-CM | POA: Diagnosis not present

## 2020-12-22 DIAGNOSIS — E559 Vitamin D deficiency, unspecified: Secondary | ICD-10-CM | POA: Diagnosis not present

## 2020-12-22 DIAGNOSIS — E1159 Type 2 diabetes mellitus with other circulatory complications: Secondary | ICD-10-CM

## 2020-12-22 LAB — POCT GLYCOSYLATED HEMOGLOBIN (HGB A1C): HbA1c, POC (controlled diabetic range): 7.9 % — AB (ref 0.0–7.0)

## 2020-12-22 MED ORDER — METFORMIN HCL ER (OSM) 1000 MG PO TB24
1000.0000 mg | ORAL_TABLET | Freq: Every day | ORAL | 1 refills | Status: DC
Start: 2020-12-22 — End: 2020-12-27

## 2020-12-22 MED ORDER — TRULICITY 1.5 MG/0.5ML ~~LOC~~ SOAJ: 1.5000 mg | SUBCUTANEOUS | 2 refills | Status: DC

## 2020-12-22 NOTE — Progress Notes (Signed)
12/22/2020, 10:05 AM  Endocrinology follow-up note   Subjective:    Patient ID: [name], female    DOB: 1945/07/17.  Laurie Palmer is being seen in follow-up after she was seen in consultation for management of currently uncontrolled symptomatic diabetes requested by  Monico Blitz, MD.   Past Medical History:  Diagnosis Date   Colon cancer ([place])    Elevated carcinoembryonic antigen (CEA)    Hypercalcemia    Mass of left thigh     Past Surgical History:  Procedure Laterality Date   IRRIGATION AND DEBRIDEMENT ABSCESS Left 03/25/2015   Procedure: IRRIGATION AND DEBRIDEMENT ABSCESS LEFT THIGH WITH BIOPSIES AND WOUND VAC PLACEMENT;  Surgeon: Hall Busing, MD;  Location: [facility];  Service: General;  Laterality: Left;    Social History   Socioeconomic History   Marital status: Widowed    Spouse name: Not on file   Number of children: Not on file   Years of education: Not on file   Highest education level: Not on file  Occupational History   Not on file  Tobacco Use   Smoking status: Never   Smokeless tobacco: Never  Vaping Use   Vaping Use: Never used  Substance and Sexual Activity   Alcohol use: No    Alcohol/week: 0.0 standard drinks   Drug use: No   Sexual activity: Not on file  Other Topics Concern   Not on file  Social History Narrative   Not on file   Social Determinants of Health   Financial Resource Strain: Not on file  Food Insecurity: Not on file  Transportation Needs: Not on file  Physical Activity: Not on file  Stress: Not on file  Social Connections: Not on file    Family History  Problem Relation Age of Onset   Diabetes Mother    Hypertension Mother    Hyperlipidemia Mother    Heart attack Mother    Heart failure Mother    Prostate cancer Father    Prostate cancer Brother     Outpatient Encounter Medications as of 12/22/2020  Medication  Sig   Dulaglutide (TRULICITY) 1.5 0000000 SOPN Inject 1.5 mg into the skin once a week.   metformin (FORTAMET) 1000 MG (OSM) 24 hr tablet Take 1 tablet (1,000 mg total) by mouth daily with breakfast.   aspirin (ASPIRIN EC) 81 MG EC tablet Take 81 mg by mouth daily.    cefUROXime (CEFTIN) 250 MG tablet Take 250 mg by mouth 2 (two) times daily.   Cholecalciferol 50 MCG (2000 UT) CAPS Take 1 capsule (2,000 Units total) by mouth daily with breakfast.   CINNAMON PO Take 1 tablet by mouth daily.   Continuous Blood Gluc Sensor (FREESTYLE LIBRE 14 DAY SENSOR) MISC Inject 1 each into the skin every 14 (fourteen) days. Use as directed.   diclofenac (VOLTAREN) 75 MG EC tablet Take 75 mg by mouth 2 (two) times daily.   insulin aspart (NOVOLOG) 100 UNIT/ML injection Inject 12-18 Units into the skin 3 (three) times daily before meals.   insulin detemir (LEVEMIR) 100 UNIT/ML injection Inject 50 Units into  the skin at bedtime.   pravastatin (PRAVACHOL) 20 MG tablet Take 20 mg by mouth daily.   [DISCONTINUED] liraglutide (VICTOZA) 18 MG/3ML SOPN Inject 1.8 mg into the skin 2 (two) times a week.   [DISCONTINUED] metFORMIN (GLUCOPHAGE) 1000 MG tablet Take 1,000 mg by mouth daily with breakfast.   No facility-administered encounter medications on file as of 12/22/2020.    ALLERGIES: Allergies  Allergen Reactions   Shellfish Allergy Swelling and Anaphylaxis   Lipitor [Atorvastatin] Nausea Only    VACCINATION STATUS: There is no immunization history for the selected administration types on file for this patient.  Diabetes She presents for her follow-up diabetic visit. She has type 2 diabetes mellitus. Onset time: She was diagnosed at approximate age of 55 years. Her disease course has been improving. There are no hypoglycemic associated symptoms. Pertinent negatives for hypoglycemia include no confusion, headaches, pallor or seizures. Associated symptoms include fatigue, polydipsia and polyuria. Pertinent  negatives for diabetes include no chest pain and no polyphagia. There are no hypoglycemic complications. Symptoms are improving. There are no diabetic complications. Risk factors for coronary artery disease include dyslipidemia, diabetes mellitus, obesity, sedentary lifestyle and post-menopausal. Her weight is fluctuating minimally. She is following a generally unhealthy diet. When asked about meal planning, she reported none. She has not had a previous visit with a dietitian. She rarely participates in exercise. Her home blood glucose trend is decreasing steadily. Her breakfast blood glucose range is generally 140-180 mg/dl. Her lunch blood glucose range is generally 140-180 mg/dl. Her dinner blood glucose range is generally 140-180 mg/dl. Her bedtime blood glucose range is generally 140-180 mg/dl. Her overall blood glucose range is 140-180 mg/dl. (She presents with her CGM device showing 69% time in range, 30% slightly above range.  No hypoglycemia.  Her recent previsit labs show A1c of 7.8%, improving from 9.8%.   Average blood glucose from a CGM is 168. )  Hyperlipidemia This is a chronic problem. The current episode started more than 1 year ago. The problem is uncontrolled. Exacerbating diseases include diabetes and obesity. Pertinent negatives include no chest pain, myalgias or shortness of breath. Current antihyperlipidemic treatment includes statins. Risk factors for coronary artery disease include dyslipidemia, diabetes mellitus, family history, obesity, hypertension, a sedentary lifestyle and post-menopausal.    Review of Systems  Constitutional: Positive for fatigue. Negative for chills, fever and unexpected weight change.  HENT: Negative for trouble swallowing and voice change.   Eyes: Negative for visual disturbance.  Respiratory: Negative for cough, shortness of breath and wheezing.   Cardiovascular: Negative for chest pain, palpitations and leg swelling.  Gastrointestinal: Negative for  diarrhea, nausea and vomiting.  Endocrine: Positive for polydipsia and polyuria. Negative for cold intolerance, heat intolerance and polyphagia.  Musculoskeletal: Negative for arthralgias and myalgias.  Skin: Negative for color change, pallor, rash and wound.  Neurological: Negative for seizures and headaches.  Psychiatric/Behavioral: Negative for confusion and suicidal ideas.    Objective:    Vitals with BMI 12/22/2020 08/11/2020 04/29/2020  Height '5\' 11"'$  '5\' 11"'$  -  Weight 269 lbs 13 oz 256 lbs 264 lbs  BMI 37.65 99991111 [id number]  Systolic [id number] 0000000 -  Diastolic 76 97 -  Pulse 80 82 -    BP 130/76   Pulse 80   Ht '5\' 11"'$  (1.803 m)   Wt 269 lb 12.8 oz (122.4 kg)   BMI 37.63 kg/m   Wt Readings from Last 3 Encounters:  12/22/20 269 lb 12.8 oz (122.4 kg)  08/11/20 256  lb (116.1 kg)  04/29/20 264 lb (119.7 kg)     Physical Exam Constitutional:      Appearance: She is well-developed.  HENT:     Head: Normocephalic and atraumatic.  Neck:     Thyroid: No thyromegaly.     Trachea: No tracheal deviation.  Cardiovascular:     Rate and Rhythm: Normal rate and regular rhythm.  Pulmonary:     Effort: Pulmonary effort is normal.  Abdominal:     Tenderness: There is no abdominal tenderness. There is no guarding.  Musculoskeletal:        General: Normal range of motion.     Cervical back: Normal range of motion and neck supple.     Comments: Normal foot exam today.  Skin:    General: Skin is warm and dry.     Coloration: Skin is not pale.     Findings: No erythema or rash.  Neurological:     Mental Status: She is alert and oriented to person, place, and time.     Cranial Nerves: No cranial nerve deficit.     Coordination: Coordination normal.     Deep Tendon Reflexes: Reflexes are normal and symmetric.  Psychiatric:        Judgment: Judgment normal.      CMP ( most recent) CMP     Component Value Date/Time   NA 141 08/05/2020 0912   K 4.7 08/05/2020 0912   CL 100 08/05/2020  0912   CO2 24 08/05/2020 0912   GLUCOSE 142 (H) 08/05/2020 0912   GLUCOSE 135 (H) 03/26/2015 0540   BUN 10 08/05/2020 0912   CREATININE 0.79 08/05/2020 0912   CALCIUM 10.9 (H) 08/05/2020 0912   PROT 7.5 08/05/2020 0912   ALBUMIN 4.3 08/05/2020 0912   AST 16 08/05/2020 0912   ALT 15 08/05/2020 0912   ALKPHOS 94 08/05/2020 0912   BILITOT 0.6 08/05/2020 0912   GFRNONAA 64 01/14/2020 0955   GFRAA 74 01/14/2020 0955     Diabetic Labs (most recent): Lab Results  Component Value Date   HGBA1C 7.9 (A) 12/22/2020   HGBA1C 7.8 08/05/2020   HGBA1C 9.8 (A) 04/29/2020    CMP Latest Ref Rng & Units 08/05/2020 04/20/2020 01/14/2020  Glucose 65 - 99 mg/dL 142(H) - 73  BUN 8 - 27 mg/dL 10 - 18  Creatinine 0.57 - 1.00 mg/dL 0.79 - 0.89  Sodium 134 - 144 mmol/L 141 - 141  Potassium 3.5 - 5.2 mmol/L 4.7 - 4.4  Chloride 96 - 106 mmol/L 100 - 103  CO2 20 - 29 mmol/L 24 - 24  Calcium 8.7 - 10.3 mg/dL 10.9(H) 10.8(H) 11.1(H)  Total Protein 6.0 - 8.5 g/dL 7.5 - 7.2  Total Bilirubin 0.0 - 1.2 mg/dL 0.6 - 0.4  Alkaline Phos 44 - 121 IU/L 94 - 119  AST 0 - 40 IU/L 16 - 16  ALT 0 - 32 IU/L 15 - 21   Lipid Panel     Component Value Date/Time   CHOL 282 (H) 01/14/2020 0955   TRIG 222 (H) 01/14/2020 0955   HDL 41 01/14/2020 0955   CHOLHDL 6.9 (H) 01/14/2020 0955   LDLCALC 198 (H) 01/14/2020 0955   LABVLDL 43 (H) 01/14/2020 0955      Assessment & Plan:   1. DM type 2 causing vascular disease ([place])   - Laurie Shelling Bronkema has currently uncontrolled symptomatic type 2 DM since  75 years of age.  She presents with her CGM device showing  68% time in range, 30% above range, no significant hypoglycemia.  Her average blood glucose is 160 for the last 14 days.  Her point-of-care A1c is 7.9%.      - I had a long discussion with her about the progressive nature of diabetes and the pathology behind its complications. -her diabetes is complicated by obesity/sedentary life and she remains at a high  risk for more acute and chronic complications which include CAD, CVA, CKD, retinopathy, and neuropathy. These are all discussed in detail with her.  - I have counseled her on diet  and weight management  by adopting a carbohydrate restricted/protein rich diet. Patient is encouraged to switch to  unprocessed or minimally processed     complex starch and increased protein intake (animal or plant source), fruits, and vegetables. -  she is advised to stick to a routine mealtimes to eat 3 meals  a day and avoid unnecessary snacks ( to snack only to correct hypoglycemia).   - she acknowledges that there is a room for improvement in her food and drink choices. - Suggestion is made for her to avoid simple carbohydrates  from her diet including Cakes, Sweet Desserts, Ice Cream, Soda (diet and regular), Sweet Tea, Candies, Chips, Cookies, Store Bought Juices, Alcohol in Excess of  1-2 drinks a day, Artificial Sweeteners,  Coffee Creamer, and "Sugar-free" Products, Lemonade. This will help patient to have more stable blood glucose profile and potentially avoid unintended weight gain.   - she will be scheduled with Jearld Fenton, RDN, CDE for diabetes education.  - I have approached her with the following individualized plan to manage  her diabetes and patient agrees:   - she will continue to need intensive treatment with basal/bolus insulin  in order for her to achieve and maintain control of diabetes to target.   -She is advised to continue Levemir 50 units nightly, continue NovoLog 12-18 units 3 times a day with meals  for pre-meal BG readings of 90-'150mg'$ /dl, plus patient specific correction dose for unexpected hyperglycemia above '150mg'$ /dl, associated with strict monitoring of glucose 4 times a day-before meals and at bedtime. -Proper insulin dose and administration is reviewed once again with her. -She has received the freestyle libre CGM device, advised to use it at all times.    - she is warned not to  take insulin without proper monitoring per orders. - Adjustment parameters are given to her for hypo and hyperglycemia in writing. - she is encouraged to call clinic for blood glucose levels less than 70 or above 200 mg /dl. - she is not tolerating regular strength metformin.  I discussed and switched her metformin to 1000 mg ER p.o. daily at breakfast, and switch her Victoza to Trulicity 1.5 mg subcutaneously weekly.    - Specific targets for  A1c;  LDL, HDL,  and Triglycerides were discussed with the patient.  2) Blood Pressure /Hypertension:  Her blood pressure is controlled to target.  She reports that the first time she ever had elevated blood pressure.  She is advised to keep monitoring, will be considered for low-dose ACE inhibitor if blood pressure remains above 130/80 next visit.    3) Lipids/Hyperlipidemia:   Review of her recent lipid panel showed uncontrolled  LDL at 211 .  She reports better consistency and compliance with her pravastatin 20 mg p.o. nightly.    She is advised to continue.   Side effects and precautions discussed with her.  4)  Weight/Diet:  Body mass index  is 37.63 kg/m.  -   clearly complicating her diabetes care.   she is  a candidate for weight loss. I discussed with her the fact that loss of 5 - 10% of her  current body weight will have the most impact on her diabetes management.  Exercise, and detailed carbohydrates information provided  -  detailed on discharge instructions.  5) vitamin D deficiency:  She is currently on ongoing ergocalciferol 50,000 units weekly for 12 weeks. -After she finished her high-dose supplement, she will continue vitamin D3 2000 units daily for maintenance. Her labs show persistent mild hypercalcemia of 10.9, however improving PTH of 58.  This is still possibly due to early mild primary hyperparathyroidism.    She will continue to need follow-up with PTH/calcium and 24-hour urine calcium study on subsequent visits, after her vitamin D  is corrected.  6) Chronic Care/Health Maintenance:  -she  Is non Statin medications and  is encouraged to initiate and continue to follow up with Ophthalmology, Dentist,  Podiatrist at least yearly or according to recommendations, and advised to   stay away from smoking. I have recommended yearly flu vaccine and pneumonia vaccine at least every 5 years; moderate intensity exercise for up to 150 minutes weekly; and  sleep for at least 7 hours a day.   POCT ABI Results 12/22/20  Her screening ABI is negative for PAD today August 11, 2020.  This study will be repeated in March 2027, or sooner if needed. Right ABI: 1.2      left ABI: 1.16  Right leg systolic / diastolic: [id number] mmHg Left leg systolic / diastolic: 99991111 mmHg  Arm systolic / diastolic: [id number] mmHG  - she is  advised to maintain close follow up with Monico Blitz, MD for primary care needs, as well as her other providers for optimal and coordinated care.    I spent 34 minutes in the care of the patient today including review of labs from [place], Lipids, Thyroid Function, Hematology (current and previous including abstractions from other facilities); face-to-face time discussing  her blood glucose readings/logs, discussing hypoglycemia and hyperglycemia episodes and symptoms, medications doses, her options of short and long term treatment based on the latest standards of care / guidelines;  discussion about incorporating lifestyle medicine;  and documenting the encounter.    Please refer to Patient Instructions for Blood Glucose Monitoring and Insulin/Medications Dosing Guide"  in media tab for additional information. Please  also refer to " Patient Self Inventory" in the Media  tab for reviewed elements of pertinent patient history.  Laurie Palmer participated in the discussions, expressed understanding, and voiced agreement with the above plans.  All questions were answered to her satisfaction. she is encouraged to contact clinic  should she have any questions or concerns prior to her return visit.    Follow up plan: - Return in about 4 months (around 04/23/2021) for F/U with Pre-visit Labs, Meter, Logs, A1c here.Glade Lloyd, MD [facility] [address] [place], [place] [address] Phone: [phone]  Fax: [phone]    12/22/2020, 10:05 AM  This note was partially dictated with voice recognition software. Similar sounding words can be transcribed inadequately or may not  be corrected upon review.

## 2020-12-22 NOTE — Patient Instructions (Signed)

## 2020-12-27 ENCOUNTER — Other Ambulatory Visit: Payer: Self-pay

## 2020-12-27 DIAGNOSIS — E1159 Type 2 diabetes mellitus with other circulatory complications: Secondary | ICD-10-CM

## 2020-12-27 MED ORDER — METFORMIN HCL ER 500 MG PO TB24: 1000.0000 mg | ORAL_TABLET | Freq: Every day | ORAL | 3 refills | Status: DC

## 2021-01-24 HISTORY — PX: CATARACT EXTRACTION: SUR2

## 2021-04-19 LAB — LIPID PANEL
Chol/HDL Ratio: 6.9 ratio — ABNORMAL HIGH (ref 0.0–4.4)
Cholesterol, Total: 263 mg/dL — ABNORMAL HIGH (ref 100–199)
HDL: 38 mg/dL — ABNORMAL LOW (ref >39)
LDL Chol Calc (NIH): 179 mg/dL — ABNORMAL HIGH (ref 0–99)
Triglycerides: 240 mg/dL — ABNORMAL HIGH (ref 0–149)
VLDL Cholesterol Cal: 46 mg/dL — ABNORMAL HIGH (ref 5–40)

## 2021-04-19 LAB — COMPREHENSIVE METABOLIC PANEL
ALT: 21 IU/L (ref 0–32)
AST: 19 IU/L (ref 0–40)
Albumin/Globulin Ratio: 1.2 (ref 1.2–2.2)
Albumin: 3.9 g/dL (ref 3.7–4.7)
Alkaline Phosphatase: 115 IU/L (ref 44–121)
BUN/Creatinine Ratio: 16 (ref 12–28)
BUN: 14 mg/dL (ref 8–27)
Bilirubin Total: 0.3 mg/dL (ref 0.0–1.2)
CO2: 23 mmol/L (ref 20–29)
Calcium: 10.6 mg/dL — ABNORMAL HIGH (ref 8.7–10.3)
Chloride: 102 mmol/L (ref 96–106)
Creatinine, Ser: 0.9 mg/dL (ref 0.57–1.00)
Globulin, Total: 3.2 g/dL (ref 1.5–4.5)
Glucose: 124 mg/dL — ABNORMAL HIGH (ref 70–99)
Potassium: 5.3 mmol/L — ABNORMAL HIGH (ref 3.5–5.2)
Sodium: 140 mmol/L (ref 134–144)
Total Protein: 7.1 g/dL (ref 6.0–8.5)
eGFR: 67 mL/min/{1.73_m2} (ref >59)

## 2021-04-19 LAB — T4, FREE: Free T4: 1.84 ng/dL — ABNORMAL HIGH (ref 0.82–1.77)

## 2021-04-19 LAB — TSH: TSH: 0.011 u[IU]/mL — ABNORMAL LOW (ref 0.450–4.500)

## 2021-04-25 ENCOUNTER — Other Ambulatory Visit: Payer: Self-pay

## 2021-04-25 ENCOUNTER — Ambulatory Visit (INDEPENDENT_AMBULATORY_CARE_PROVIDER_SITE_OTHER): Payer: Medicare Other | Admitting: "Endocrinology

## 2021-04-25 ENCOUNTER — Encounter: Payer: Self-pay | Admitting: "Endocrinology

## 2021-04-25 VITALS — BP 128/78 | HR 84 | Ht 71.0 in | Wt 270.6 lb

## 2021-04-25 DIAGNOSIS — E559 Vitamin D deficiency, unspecified: Secondary | ICD-10-CM

## 2021-04-25 DIAGNOSIS — E1159 Type 2 diabetes mellitus with other circulatory complications: Secondary | ICD-10-CM

## 2021-04-25 DIAGNOSIS — I10 Essential (primary) hypertension: Secondary | ICD-10-CM

## 2021-04-25 DIAGNOSIS — E782 Mixed hyperlipidemia: Secondary | ICD-10-CM | POA: Diagnosis not present

## 2021-04-25 LAB — POCT GLYCOSYLATED HEMOGLOBIN (HGB A1C): HbA1c, POC (controlled diabetic range): 7.6 % — AB (ref 0.0–7.0)

## 2021-04-25 MED ORDER — PRAVASTATIN SODIUM 20 MG PO TABS: 20.0000 mg | ORAL_TABLET | Freq: Every day | ORAL | 1 refills | Status: DC

## 2021-04-25 MED ORDER — TRULICITY 3 MG/0.5ML ~~LOC~~ SOAJ: 3.0000 mg | SUBCUTANEOUS | 2 refills | Status: DC

## 2021-04-25 NOTE — Patient Instructions (Signed)

## 2021-04-25 NOTE — Progress Notes (Signed)
04/25/2021, 1:45 PM  Endocrinology follow-up note   Subjective:    Patient ID: Laurie Palmer, female    DOB: 23-May-1945.  Laurie Palmer is being seen in follow-up after she was seen in consultation for management of currently uncontrolled symptomatic diabetes requested by  Monico Blitz, MD.   Past Medical History:  Diagnosis Date   Colon cancer [facility])    Elevated carcinoembryonic antigen (CEA)    Hypercalcemia    Mass of left thigh     Past Surgical History:  Procedure Laterality Date   CATARACT EXTRACTION Bilateral 01/24/2021   IRRIGATION AND DEBRIDEMENT ABSCESS Left 03/25/2015   Procedure: IRRIGATION AND DEBRIDEMENT ABSCESS LEFT THIGH WITH BIOPSIES AND WOUND VAC PLACEMENT;  Surgeon: Hall Busing, MD;  Location: [facility];  Service: General;  Laterality: Left;    Social History   Socioeconomic History   Marital status: Widowed    Spouse name: Not on file   Number of children: Not on file   Years of education: Not on file   Highest education level: Not on file  Occupational History   Not on file  Tobacco Use   Smoking status: Never   Smokeless tobacco: Never  Vaping Use   Vaping Use: Never used  Substance and Sexual Activity   Alcohol use: No    Alcohol/week: 0.0 standard drinks   Drug use: No   Sexual activity: Not on file  Other Topics Concern   Not on file  Social History Narrative   Not on file   Social Determinants of Health   Financial Resource Strain: Not on file  Food Insecurity: Not on file  Transportation Needs: Not on file  Physical Activity: Not on file  Stress: Not on file  Social Connections: Not on file    Family History  Problem Relation Age of Onset   Diabetes Mother    Hypertension Mother    Hyperlipidemia Mother    Heart attack Mother    Heart failure Mother    Prostate cancer Father    Prostate cancer Brother     Outpatient  Encounter Medications as of 04/25/2021  Medication Sig   Dulaglutide (TRULICITY) 3 FA/2.1HY SOPN Inject 3 mg as directed once a week.   aspirin (ASPIRIN EC) 81 MG EC tablet Take 81 mg by mouth daily.    Cholecalciferol 50 MCG (2000 UT) CAPS Take 1 capsule (2,000 Units total) by mouth daily with breakfast.   CINNAMON PO Take 1 tablet by mouth daily.   Continuous Blood Gluc Sensor (FREESTYLE LIBRE 14 DAY SENSOR) MISC Inject 1 each into the skin every 14 (fourteen) days. Use as directed.   insulin aspart (NOVOLOG) 100 UNIT/ML injection Inject 12-18 Units into the skin 3 (three) times daily before meals.   insulin detemir (LEVEMIR) 100 UNIT/ML injection Inject 50 Units into the skin at bedtime.   metFORMIN (GLUCOPHAGE-XR) 500 MG 24 hr tablet Take 2 tablets (1,000 mg total) by mouth daily with breakfast.   pravastatin (PRAVACHOL) 20 MG tablet Take 1 tablet (20 mg total) by mouth daily.   [DISCONTINUED] cefUROXime (CEFTIN) 250 MG tablet Take  250 mg by mouth 2 (two) times daily.   [DISCONTINUED] diclofenac (VOLTAREN) 75 MG EC tablet Take 75 mg by mouth 2 (two) times daily.   [DISCONTINUED] Dulaglutide (TRULICITY) 1.5 DJ/4.9FW SOPN Inject 1.5 mg into the skin once a week.   [DISCONTINUED] pravastatin (PRAVACHOL) 20 MG tablet Take 20 mg by mouth daily. (Patient not taking: Reported on 04/25/2021)   No facility-administered encounter medications on file as of 04/25/2021.    ALLERGIES: Allergies  Allergen Reactions   Shellfish Allergy Swelling and Anaphylaxis   Lipitor [Atorvastatin] Nausea Only    VACCINATION STATUS: There is no immunization history for the selected administration types on file for this patient.  HPI  Review of Systems  Objective:    Vitals with BMI 04/25/2021 12/22/2020 08/11/2020  Height 5\' 11"  5\' 11"  5\' 11"   Weight 270 lbs 10 oz 269 lbs 13 oz 256 lbs  BMI 37.76 26.37 85.88  Systolic 502 774 128  Diastolic 78 76 97  Pulse 84 80 82    BP 128/78   Pulse 84   Ht 5'  11" (1.803 m)   Wt 270 lb 9.6 oz (122.7 kg)   BMI 37.74 kg/m   Wt Readings from Last 3 Encounters:  04/25/21 270 lb 9.6 oz (122.7 kg)  12/22/20 269 lb 12.8 oz (122.4 kg)  08/11/20 256 lb (116.1 kg)     Physical Exam   CMP ( most recent) CMP     Component Value Date/Time   NA 140 04/18/2021 1002   K 5.3 (H) 04/18/2021 1002   CL 102 04/18/2021 1002   CO2 23 04/18/2021 1002   GLUCOSE 124 (H) 04/18/2021 1002   GLUCOSE 135 (H) 03/26/2015 0540   BUN 14 04/18/2021 1002   CREATININE 0.90 04/18/2021 1002   CALCIUM 10.6 (H) 04/18/2021 1002   PROT 7.1 04/18/2021 1002   ALBUMIN 3.9 04/18/2021 1002   AST 19 04/18/2021 1002   ALT 21 04/18/2021 1002   ALKPHOS 115 04/18/2021 1002   BILITOT 0.3 04/18/2021 1002   GFRNONAA 64 01/14/2020 0955   GFRAA 74 01/14/2020 0955     Diabetic Labs (most recent): Lab Results  Component Value Date   HGBA1C 7.6 (A) 04/25/2021   HGBA1C 7.9 (A) 12/22/2020   HGBA1C 7.8 08/05/2020    CMP Latest Ref Rng & Units 04/18/2021 08/05/2020 04/20/2020  Glucose 70 - 99 mg/dL 124(H) 142(H) -  BUN 8 - 27 mg/dL 14 10 -  Creatinine 0.57 - 1.00 mg/dL 0.90 0.79 -  Sodium 134 - 144 mmol/L 140 141 -  Potassium 3.5 - 5.2 mmol/L 5.3(H) 4.7 -  Chloride 96 - 106 mmol/L 102 100 -  CO2 20 - 29 mmol/L 23 24 -  Calcium 8.7 - 10.3 mg/dL 10.6(H) 10.9(H) 10.8(H)  Total Protein 6.0 - 8.5 g/dL 7.1 7.5 -  Total Bilirubin 0.0 - 1.2 mg/dL 0.3 0.6 -  Alkaline Phos 44 - 121 IU/L 115 94 -  AST 0 - 40 IU/L 19 16 -  ALT 0 - 32 IU/L 21 15 -   Lipid Panel     Component Value Date/Time   CHOL 263 (H) 04/18/2021 1002   TRIG 240 (H) 04/18/2021 1002   HDL 38 (L) 04/18/2021 1002   CHOLHDL 6.9 (H) 04/18/2021 1002   LDLCALC 179 (H) 04/18/2021 1002   LABVLDL 46 (H) 04/18/2021 1002      Assessment & Plan:   1. DM type 2 causing vascular disease (Laurie Palmer)   - Laurie Palmer has  currently uncontrolled symptomatic type 2 DM since  75 years of age.  Laurie Palmer presents with  significant improvement in her glycemic profile .  Her CGM was reviewed.  AGP report shows 76% time range, 22% slightly above range.  No significant hypoglycemia.  Her point-of-care A1c is 7.6% overall improving.  - I had a long discussion with her about the progressive nature of diabetes and the pathology behind its complications. -her diabetes is complicated by obesity/sedentary life and she remains at a high risk for more acute and chronic complications which include CAD, CVA, CKD, retinopathy, and neuropathy. These are all discussed in detail with her.  - I have counseled her on diet  and weight management  by adopting a carbohydrate restricted/protein rich diet. Patient is encouraged to switch to  unprocessed or minimally processed     complex starch and increased protein intake (animal or plant source), fruits, and vegetables. -  she is advised to stick to a routine mealtimes to eat 3 meals  a day and avoid unnecessary snacks ( to snack only to correct hypoglycemia).  - she acknowledges that there is a room for improvement in her food and drink choices. - Suggestion is made for her to avoid simple carbohydrates  from her diet including Cakes, Sweet Desserts, Ice Cream, Soda (diet and regular), Sweet Tea, Candies, Chips, Cookies, Store Bought Juices, Alcohol , Artificial Sweeteners,  Coffee Creamer, and "Sugar-free" Products, Lemonade. This will help patient to have more stable blood glucose profile and potentially avoid unintended weight gain.  The following Lifestyle Medicine recommendations according to [place]  [facility]) were discussed and and offered to patient and she  agrees to start the journey:  A. Whole Foods, Plant-Based Nutrition comprising of fruits and vegetables, plant-based proteins, whole-grain carbohydrates was discussed in detail with the patient.   A list for source of those nutrients were also provided to the patient.  Patient will use only water or  unsweetened tea for hydration. B.  The need to stay away from risky substances including alcohol, smoking; obtaining 7 to 9 hours of restorative sleep, at least 150 minutes of moderate intensity exercise weekly, the importance of healthy social connections,  and stress management techniques were discussed. C.  A full color page of  Calorie density of various food groups per pound showing examples of each food groups was provided to the patient.   - she will be scheduled with Jearld Fenton, RDN, CDE for diabetes education.  - I have approached her with the following individualized plan to manage  her diabetes and patient agrees:   -While she is working on her lifestyle modification, she is advised to continue Lantus 50 units,  continue NovoLog 12-18 units 3 times a day with meals  for pre-meal BG readings of 90-150mg /dl, plus patient specific correction dose for unexpected hyperglycemia above 150mg /dl, associated with strict monitoring of glucose 4 times a day-before meals and at bedtime. -Proper insulin dose and administration is reviewed once again with her. -She is encouraged to continue to use her CGM at all times.    - she is warned not to take insulin without proper monitoring per orders. - Adjustment parameters are given to her for hypo and hyperglycemia in writing. - she is encouraged to call clinic for blood glucose levels less than 70 or above 200 mg /dl. - she is is tolerating the extended release metformin, advised to continue metformin 1000 mg ER p.o. daily at breakfast.   -  She is also on Trulicity.  I discussed and increase her Trulicity to 3 mg subcutaneously weekly.  - Specific targets for  A1c;  LDL, HDL,  and Triglycerides were discussed with the patient.  2) Blood Pressure /Hypertension:  -Her blood pressure is controlled to target.  She reports that the first time she ever had elevated blood pressure.  She is advised to keep monitoring, will be considered for low-dose ACE  inhibitor if blood pressure remains above 130/80 next visit.    3) Lipids/Hyperlipidemia:   Review of her recent lipid panel showed uncontrolled  LDL at 179, with history of LDL as high as 211 .  She reports inconsistency taking her statin.  She is supposed to be on pravastatin 20 mg daily, advised to continue.  The above discussed lifestyle medicine recommendations will help significantly with dyslipidemia.       Side effects and precautions discussed with her.  4)  Weight/Diet:  Body mass index is 37.74 kg/m.  -   clearly complicating her diabetes care.   she is  a candidate for weight loss. I discussed with her the fact that loss of 5 - 10% of her  current body weight will have the most impact on her diabetes management.  Exercise, and detailed carbohydrates information provided  -  detailed on discharge instructions.  5) vitamin D deficiency:  She is currently on ongoing ergocalciferol 50,000 units weekly for 12 weeks. -After she finished her high-dose supplement, she will continue vitamin D3 2000 units daily for maintenance. Her labs show persistent mild hypercalcemia of 10.9, however improving PTH of 58.  This is still possibly due to early mild primary hyperparathyroidism.    She will continue to need follow-up with PTH/calcium and 24-hour urine calcium study on subsequent visits, after her vitamin D is corrected.  6) Chronic Care/Health Maintenance:  -she  Is non Statin medications and  is encouraged to initiate and continue to follow up with Ophthalmology, Dentist,  Podiatrist at least yearly or according to recommendations, and advised to   stay away from smoking. I have recommended yearly flu vaccine and pneumonia vaccine at least every 5 years; moderate intensity exercise for up to 150 minutes weekly; and  sleep for at least 7 hours a day.   Her screening ABI was negative for PAD in March 2022.  Her neck study will be in March 2027 or sooner if needed.    - she is  advised to  maintain close follow up with Monico Blitz, MD for primary care needs, as well as her other providers for optimal and coordinated care.   I spent 42 minutes in the care of the patient today including review of labs from [place], Lipids, Thyroid Function, Hematology (current and previous including abstractions from other facilities); face-to-face time discussing  her blood glucose readings/logs, discussing hypoglycemia and hyperglycemia episodes and symptoms, medications doses, her options of short and long term treatment based on the latest standards of care / guidelines;  discussion about incorporating lifestyle medicine;  and documenting the encounter.    Please refer to Patient Instructions for Blood Glucose Monitoring and Insulin/Medications Dosing Guide"  in media tab for additional information. Please  also refer to " Patient Self Inventory" in the Media  tab for reviewed elements of pertinent patient history.  Laurie Palmer participated in the discussions, expressed understanding, and voiced agreement with the above plans.  All questions were answered to her satisfaction. she is encouraged to contact clinic should she  have any questions or concerns prior to her return visit.    Follow up plan: - Return in about 3 months (around 07/24/2021) for Bring Meter and Logs- A1c in Office.  Glade Lloyd, MD [facility] [address] [place], [place] [address] Phone: [phone]  Fax: [phone]    04/25/2021, 1:45 PM  This note was partially dictated with voice recognition software. Similar sounding words can be transcribed inadequately or may not  be corrected upon review.

## 2021-05-03 ENCOUNTER — Telehealth: Payer: Self-pay | Admitting: "Endocrinology

## 2021-05-03 NOTE — Telephone Encounter (Signed)
Pt sister Hassan Rowan said she dropped off papers for pt forms for Trulicity to be faxed to [facility] on 04/25/21. She states she just talked with them and they have not received anything. She would like to follow up on this. Hassan Rowan can be reached at [phone] or you can contact pt

## 2021-05-03 NOTE — Telephone Encounter (Signed)
Discussed with pt's sister we faxed pt's re-enrollment paperwork to [facility] on 04/25/21. She stated she was told by a [employer] representative they had not received information. Refaxed paperwork for pt assistance.

## 2021-07-27 ENCOUNTER — Encounter: Payer: Self-pay | Admitting: "Endocrinology

## 2021-07-27 ENCOUNTER — Other Ambulatory Visit: Payer: Self-pay

## 2021-07-27 ENCOUNTER — Ambulatory Visit (INDEPENDENT_AMBULATORY_CARE_PROVIDER_SITE_OTHER): Payer: Medicare Other | Admitting: "Endocrinology

## 2021-07-27 VITALS — BP 124/72 | HR 88 | Ht 71.0 in | Wt 277.0 lb

## 2021-07-27 DIAGNOSIS — E559 Vitamin D deficiency, unspecified: Secondary | ICD-10-CM

## 2021-07-27 DIAGNOSIS — E1159 Type 2 diabetes mellitus with other circulatory complications: Secondary | ICD-10-CM | POA: Diagnosis not present

## 2021-07-27 DIAGNOSIS — I10 Essential (primary) hypertension: Secondary | ICD-10-CM

## 2021-07-27 DIAGNOSIS — E782 Mixed hyperlipidemia: Secondary | ICD-10-CM | POA: Diagnosis not present

## 2021-07-27 LAB — POCT GLYCOSYLATED HEMOGLOBIN (HGB A1C): HbA1c, POC (controlled diabetic range): 7.8 % — AB (ref 0.0–7.0)

## 2021-07-27 NOTE — Progress Notes (Signed)
? ?                                                            ?     07/27/2021, 5:05 PM ? ?Endocrinology follow-up note ? ? ?Subjective:  ? ? Patient ID: Laurie Palmer, female    DOB: 1945/07/12.  ?Laurie Palmer is being seen in follow-up after she was seen in consultation for management of currently uncontrolled symptomatic diabetes requested by  Monico Blitz, MD. ? ? ?Past Medical History:  ?Diagnosis Date  ? Colon cancer ([place])   ? Elevated carcinoembryonic antigen (CEA)   ? Hypercalcemia   ? Mass of left thigh   ? ? ?Past Surgical History:  ?Procedure Laterality Date  ? CATARACT EXTRACTION Bilateral 01/24/2021  ? IRRIGATION AND DEBRIDEMENT ABSCESS Left 03/25/2015  ? Procedure: IRRIGATION AND DEBRIDEMENT ABSCESS LEFT THIGH WITH BIOPSIES AND WOUND VAC PLACEMENT;  Surgeon: Hall Busing, MD;  Location: [facility];  Service: General;  Laterality: Left;  ? ? ?Social History  ? ?Socioeconomic History  ? Marital status: Widowed  ?  Spouse name: Not on file  ? Number of children: Not on file  ? Years of education: Not on file  ? Highest education level: Not on file  ?Occupational History  ? Not on file  ?Tobacco Use  ? Smoking status: Never  ? Smokeless tobacco: Never  ?Vaping Use  ? Vaping Use: Never used  ?Substance and Sexual Activity  ? Alcohol use: No  ?  Alcohol/week: 0.0 standard drinks  ? Drug use: No  ? Sexual activity: Not on file  ?Other Topics Concern  ? Not on file  ?Social History Narrative  ? Not on file  ? ?Social Determinants of Health  ? ?Financial Resource Strain: Not on file  ?Food Insecurity: Not on file  ?Transportation Needs: Not on file  ?Physical Activity: Not on file  ?Stress: Not on file  ?Social Connections: Not on file  ? ? ?Family History  ?Problem Relation Age of Onset  ? Diabetes Mother   ? Hypertension Mother   ? Hyperlipidemia Mother   ? Heart attack Mother   ? Heart failure Mother   ? Prostate cancer Father   ? Prostate cancer Brother   ? ? ?Outpatient Encounter  Medications as of 07/27/2021  ?Medication Sig  ? aspirin (ASPIRIN EC) 81 MG EC tablet Take 81 mg by mouth daily.   ? Cholecalciferol 50 MCG (2000 UT) CAPS Take 1 capsule (2,000 Units total) by mouth daily with breakfast.  ? CINNAMON PO Take 1 tablet by mouth daily.  ? Continuous Blood Gluc Sensor (FREESTYLE LIBRE 14 DAY SENSOR) MISC Inject 1 each into the skin every 14 (fourteen) days. Use as directed.  ? Dulaglutide (TRULICITY) 3 OJ/5.0KX SOPN Inject 3 mg as directed once a week.  ? insulin aspart (NOVOLOG) 100 UNIT/ML injection Inject 12-18 Units into the skin 3 (three) times daily before meals.  ? insulin detemir (LEVEMIR) 100 UNIT/ML injection Inject 50 Units into the skin at bedtime.  ? metFORMIN (GLUCOPHAGE-XR) 500 MG 24 hr tablet Take 2 tablets (1,000 mg total) by mouth daily with breakfast.  ? pravastatin (PRAVACHOL) 20 MG tablet Take 1 tablet (20 mg total) by mouth daily.  ? ?No facility-administered encounter medications on file as  of 07/27/2021.  ? ? ?ALLERGIES: ?Allergies  ?Allergen Reactions  ? Shellfish Allergy Swelling and Anaphylaxis  ? Lipitor [Atorvastatin] Nausea Only  ? ? ?VACCINATION STATUS: ?There is no immunization history for the selected administration types on file for this patient. ? ?Diabetes ?She presents for her follow-up diabetic visit. She has type 2 diabetes mellitus. Onset time: she was diagnosed ta approx age of 22 yrs. Her disease course has been worsening. There are no hypoglycemic associated symptoms. Associated symptoms include polydipsia and polyuria. There are no hypoglycemic complications. Symptoms are worsening. Risk factors for coronary artery disease include dyslipidemia, diabetes mellitus, family history, obesity, hypertension, post-menopausal and sedentary lifestyle. Current diabetic treatments: she is on Lantus 40  units qhs, Novlog 12 -18 units TIDAC. She is following a generally unhealthy diet. When asked about meal planning, she reported none. Her breakfast blood  glucose range is generally 130-140 mg/dl. Her lunch blood glucose range is generally 140-180 mg/dl. Her dinner blood glucose range is generally 140-180 mg/dl. Her overall blood glucose range is 140-180 mg/dl. (She presents with her CGM. AGP shows EAG of 167, 64% TIR, 36 % AR. Her POC a1c is 7.8%.)  ? ?Review of Systems  ?Endocrine: Positive for polydipsia and polyuria.  ? ?Objective:  ?  ?Vitals with BMI 07/27/2021 04/25/2021 12/22/2020  ?Height '5\' 11"'$  '5\' 11"'$  '5\' 11"'$   ?Weight 277 lbs 270 lbs 10 oz 269 lbs 13 oz  ?BMI 38.65 37.76 37.65  ?Systolic 947 096 283  ?Diastolic 72 78 76  ?Pulse 88 84 80  ? ? ?BP 124/72   Pulse 88   Ht '5\' 11"'$  (1.803 m)   Wt 277 lb (125.6 kg)   BMI 38.63 kg/m?   ?Wt Readings from Last 3 Encounters:  ?07/27/21 277 lb (125.6 kg)  ?04/25/21 270 lb 9.6 oz (122.7 kg)  ?12/22/20 269 lb 12.8 oz (122.4 kg)  ?  ? ?Physical Exam ? ? ?CMP ( most recent) ?CMP  ?   ?Component Value Date/Time  ? NA 140 04/18/2021 1002  ? K 5.3 (H) 04/18/2021 1002  ? CL 102 04/18/2021 1002  ? CO2 23 04/18/2021 1002  ? GLUCOSE 124 (H) 04/18/2021 1002  ? GLUCOSE 135 (H) 03/26/2015 0540  ? BUN 14 04/18/2021 1002  ? CREATININE 0.90 04/18/2021 1002  ? CALCIUM 10.6 (H) 04/18/2021 1002  ? PROT 7.1 04/18/2021 1002  ? ALBUMIN 3.9 04/18/2021 1002  ? AST 19 04/18/2021 1002  ? ALT 21 04/18/2021 1002  ? ALKPHOS 115 04/18/2021 1002  ? BILITOT 0.3 04/18/2021 1002  ? GFRNONAA 64 01/14/2020 0955  ? GFRAA 74 01/14/2020 0955  ? ? ? ?Diabetic Labs (most recent): ?Lab Results  ?Component Value Date  ? HGBA1C 7.8 (A) 07/27/2021  ? HGBA1C 7.6 (A) 04/25/2021  ? HGBA1C 7.9 (A) 12/22/2020  ? ? ?CMP Latest Ref Rng & Units 04/18/2021 08/05/2020 04/20/2020  ?Glucose 70 - 99 mg/dL 124(H) 142(H) -  ?BUN 8 - 27 mg/dL 14 10 -  ?Creatinine 0.57 - 1.00 mg/dL 0.90 0.79 -  ?Sodium 134 - 144 mmol/L 140 141 -  ?Potassium 3.5 - 5.2 mmol/L 5.3(H) 4.7 -  ?Chloride 96 - 106 mmol/L 102 100 -  ?CO2 20 - 29 mmol/L 23 24 -  ?Calcium 8.7 - 10.3 mg/dL 10.6(H) 10.9(H) 10.8(H)   ?Total Protein 6.0 - 8.5 g/dL 7.1 7.5 -  ?Total Bilirubin 0.0 - 1.2 mg/dL 0.3 0.6 -  ?Alkaline Phos 44 - 121 IU/L 115 94 -  ?AST 0 - 40  IU/L 19 16 -  ?ALT 0 - 32 IU/L 21 15 -  ? ?Lipid Panel  ?   ?Component Value Date/Time  ? CHOL 263 (H) 04/18/2021 1002  ? TRIG 240 (H) 04/18/2021 1002  ? HDL 38 (L) 04/18/2021 1002  ? CHOLHDL 6.9 (H) 04/18/2021 1002  ? LDLCALC 179 (H) 04/18/2021 1002  ? LABVLDL 46 (H) 04/18/2021 1002  ? ? ? ? ?Assessment & Plan:  ? ?1. DM type 2 causing vascular disease ([place]) ? ? ?- Laurie Palmer has currently uncontrolled symptomatic type 2 DM since  76 years of age. ? ?She presents with her CGM. AGP shows EAG of 167, 64% TIR, 36 % AR. Her POC a1c is 7.8%. ? ?- I had a long discussion with her about the progressive nature of diabetes and the pathology behind its complications. ?-her diabetes is complicated by obesity/sedentary life and she remains at a high risk for more acute and chronic complications which include CAD, CVA, CKD, retinopathy, and neuropathy. These are all discussed in detail with her. ? ?- I have counseled her on diet  and weight management  by adopting a carbohydrate restricted/protein rich diet. Patient is encouraged to switch to  unprocessed or minimally processed     complex starch and increased protein intake (animal or plant source), fruits, and vegetables. ?-  she is advised to stick to a routine mealtimes to eat 3 meals  a day and avoid unnecessary snacks ( to snack only to correct hypoglycemia).  ? ?- she acknowledges that there is a room for improvement in her food and drink choices. ?- Suggestion is made for her to avoid simple carbohydrates  from her diet including Cakes, Sweet Desserts, Ice Cream, Soda (diet and regular), Sweet Tea, Candies, Chips, Cookies, Store Bought Juices, Alcohol , Artificial Sweeteners,  Coffee Creamer, and "Sugar-free" Products, Lemonade. This will help patient to have more stable blood glucose profile and potentially avoid unintended  weight gain. ? ?The following Lifestyle Medicine recommendations according to [facility]) were discussed and and offered to patient and she  agrees to start the journey:  ?

## 2021-07-27 NOTE — Patient Instructions (Signed)

## 2021-09-21 ENCOUNTER — Other Ambulatory Visit: Payer: Self-pay | Admitting: Internal Medicine

## 2021-09-21 ENCOUNTER — Ambulatory Visit
Admission: RE | Admit: 2021-09-21 | Discharge: 2021-09-21 | Disposition: A | Discharge status: Home / Self Care | Payer: Medicare Other | Source: Ambulatory Visit | Attending: Internal Medicine | Admitting: Internal Medicine

## 2021-09-21 DIAGNOSIS — Z1231 Encounter for screening mammogram for malignant neoplasm of breast: Secondary | ICD-10-CM

## 2021-10-27 LAB — LIPID PANEL
Chol/HDL Ratio: 5.9 ratio — ABNORMAL HIGH (ref 0.0–4.4)
Cholesterol, Total: 254 mg/dL — ABNORMAL HIGH (ref 100–199)
HDL: 43 mg/dL (ref >39)
LDL Chol Calc (NIH): 172 mg/dL — ABNORMAL HIGH (ref 0–99)
Triglycerides: 211 mg/dL — ABNORMAL HIGH (ref 0–149)
VLDL Cholesterol Cal: 39 mg/dL (ref 5–40)

## 2021-10-27 LAB — COMPREHENSIVE METABOLIC PANEL
ALT: 17 IU/L (ref 0–32)
AST: 23 IU/L (ref 0–40)
Albumin/Globulin Ratio: 1.5 (ref 1.2–2.2)
Albumin: 4 g/dL (ref 3.7–4.7)
Alkaline Phosphatase: 101 IU/L (ref 44–121)
BUN/Creatinine Ratio: 11 — ABNORMAL LOW (ref 12–28)
BUN: 11 mg/dL (ref 8–27)
Bilirubin Total: 0.3 mg/dL (ref 0.0–1.2)
CO2: 22 mmol/L (ref 20–29)
Calcium: 10.1 mg/dL (ref 8.7–10.3)
Chloride: 103 mmol/L (ref 96–106)
Creatinine, Ser: 0.96 mg/dL (ref 0.57–1.00)
Globulin, Total: 2.7 g/dL (ref 1.5–4.5)
Glucose: 166 mg/dL — ABNORMAL HIGH (ref 70–99)
Potassium: 4.1 mmol/L (ref 3.5–5.2)
Sodium: 142 mmol/L (ref 134–144)
Total Protein: 6.7 g/dL (ref 6.0–8.5)
eGFR: 62 mL/min/{1.73_m2} (ref >59)

## 2021-11-03 ENCOUNTER — Encounter: Payer: Self-pay | Admitting: "Endocrinology

## 2021-11-03 ENCOUNTER — Ambulatory Visit (INDEPENDENT_AMBULATORY_CARE_PROVIDER_SITE_OTHER): Payer: Medicare Other | Admitting: "Endocrinology

## 2021-11-03 VITALS — BP 136/84 | HR 72 | Ht 71.0 in | Wt 276.8 lb

## 2021-11-03 DIAGNOSIS — E559 Vitamin D deficiency, unspecified: Secondary | ICD-10-CM

## 2021-11-03 DIAGNOSIS — E0590 Thyrotoxicosis, unspecified without thyrotoxic crisis or storm: Secondary | ICD-10-CM | POA: Insufficient documentation

## 2021-11-03 DIAGNOSIS — I10 Essential (primary) hypertension: Secondary | ICD-10-CM | POA: Diagnosis not present

## 2021-11-03 DIAGNOSIS — E1159 Type 2 diabetes mellitus with other circulatory complications: Secondary | ICD-10-CM | POA: Diagnosis not present

## 2021-11-03 DIAGNOSIS — E782 Mixed hyperlipidemia: Secondary | ICD-10-CM

## 2021-11-03 LAB — POCT GLYCOSYLATED HEMOGLOBIN (HGB A1C): HbA1c, POC (controlled diabetic range): 7.3 % — AB (ref 0.0–7.0)

## 2021-11-03 NOTE — Patient Instructions (Signed)
                                     Advice for Weight Management  -For most of us the best way to lose weight is by diet management. Generally speaking, diet management means consuming less calories intentionally which over time brings about progressive weight loss.  This can be achieved more effectively by avoiding ultra processed carbohydrates, processed meats, unhealthy fats.    It is critically important to know your numbers: how much calorie you are consuming and how much calorie you need. More importantly, our carbohydrates sources should be unprocessed naturally occurring  complex starch food items.  It is always important to balance nutrition also by  appropriate intake of proteins (mainly plant-based), healthy fats/oils, plenty of fruits and vegetables.   -The American College of Lifestyle Medicine (ACL M) recommends nutrition derived mostly from Whole Food, Plant Predominant Sources example an apple instead of applesauce or apple pie. Eat Plenty of vegetables, Mushrooms, fruits, Legumes, Whole Grains, Nuts, seeds in lieu of processed meats, processed snacks/pastries red meat, poultry, eggs.  Use only water or unsweetened tea for hydration.  The College also recommends the need to stay away from risky substances including alcohol, smoking; obtaining 7-9 hours of restorative sleep, at least 150 minutes of moderate intensity exercise weekly, importance of healthy social connections, and being mindful of stress and seek help when it is overwhelming.    -Sticking to a routine mealtime to eat 3 meals a day and avoiding unnecessary snacks is shown to have a big role in weight control. Under normal circumstances, the only time we burn stored energy is when we are hungry, so allow  some hunger to take place- hunger means no food between appropriate meal times, only water.  It is not advisable to starve.   -It is better to avoid simple carbohydrates including:  Cakes, Sweet Desserts, Ice Cream, Soda (diet and regular), Sweet Tea, Candies, Chips, Cookies, Store Bought Juices, Alcohol in Excess of  1-2 drinks a day, Lemonade,  Artificial Sweeteners, Doughnuts, Coffee Creamers, "Sugar-free" Products, etc, etc.  This is not a complete list.....    -Consulting with certified diabetes educators is proven to provide you with the most accurate and current information on diet.  Also, you may be  interested in discussing diet options/exchanges , we can schedule a visit with Laurie Palmer, RDN, CDE for individualized nutrition education.  -Exercise: If you are able: 30 -60 minutes a day ,4 days a week, or 150 minutes of moderate intensity exercise weekly.    The longer the better if tolerated.  Combine stretch, strength, and aerobic activities.  If you were told in the past that you have high risk for cardiovascular diseases, or if you are currently symptomatic, you may seek evaluation by your heart doctor prior to initiating moderate to intense exercise programs.                                  Additional Care Considerations for Diabetes/Prediabetes   -Diabetes  is a chronic disease.  The most important care consideration is regular follow-up with your diabetes care provider with the goal being avoiding or delaying its complications and to take advantage of advances in medications and technology.  If appropriate actions are taken early enough, type 2 diabetes can even be   reversed.  Seek information from the right source.  - Whole Food, Plant Predominant Nutrition is highly recommended: Eat Plenty of vegetables, Mushrooms, fruits, Legumes, Whole Grains, Nuts, seeds in lieu of processed meats, processed snacks/pastries red meat, poultry, eggs as recommended by American College of  Lifestyle Medicine (ACLM).  -Type 2 diabetes is known to coexist with other important comorbidities such as high blood pressure and high cholesterol.  It is critical to control not only the  diabetes but also the high blood pressure and high cholesterol to minimize and delay the risk of complications including coronary artery disease, stroke, amputations, blindness, etc.  The good news is that this diet recommendation for type 2 diabetes is also very helpful for managing high cholesterol and high blood blood pressure.  - Studies showed that people with diabetes will benefit from a class of medications known as ACE inhibitors and statins.  Unless there are specific reasons not to be on these medications, the standard of care is to consider getting one from these groups of medications at an optimal doses.  These medications are generally considered safe and proven to help protect the heart and the kidneys.    - People with diabetes are encouraged to initiate and maintain regular follow-up with eye doctors, foot doctors, dentists , and if necessary heart and kidney doctors.     - It is highly recommended that people with diabetes quit smoking or stay away from smoking, and get yearly  flu vaccine and pneumonia vaccine at least every 5 years.  See above for additional recommendations on exercise, sleep, stress management , and healthy social connections.      

## 2021-11-22 ENCOUNTER — Other Ambulatory Visit: Payer: Self-pay | Admitting: "Endocrinology

## 2021-11-22 ENCOUNTER — Encounter (HOSPITAL_COMMUNITY)
Admission: RE | Admit: 2021-11-22 | Discharge: 2021-11-22 | Disposition: A | Discharge status: Home / Self Care | Payer: Medicare Other | Source: Ambulatory Visit | Attending: "Endocrinology | Admitting: "Endocrinology

## 2021-11-22 ENCOUNTER — Telehealth: Payer: Self-pay | Admitting: "Endocrinology

## 2021-11-22 DIAGNOSIS — E1159 Type 2 diabetes mellitus with other circulatory complications: Secondary | ICD-10-CM

## 2021-11-22 DIAGNOSIS — E0590 Thyrotoxicosis, unspecified without thyrotoxic crisis or storm: Secondary | ICD-10-CM | POA: Diagnosis not present

## 2021-11-22 MED ORDER — INSULIN ASPART 100 UNIT/ML FLEXPEN: 10.0000 [IU] | PEN_INJECTOR | Freq: Three times a day (TID) | SUBCUTANEOUS | 2 refills | Status: DC

## 2021-11-22 MED ORDER — SODIUM IODIDE I 131 CAPSULE
10.0000 | Freq: Once | INTRAVENOUS | Status: AC | PRN
  Administered 2021-11-22: 18.73 via ORAL

## 2021-11-22 NOTE — Telephone Encounter (Signed)
Rx sent 

## 2021-11-22 NOTE — Telephone Encounter (Signed)
Patient called and states she is needing her Novolog refilled and Dr Manuella Ghazi office is her fills it but they are saying we need to take over. Please advise.    [place], [place]. Phone:  [phone]  Fax:  [phone]

## 2021-11-23 ENCOUNTER — Encounter (HOSPITAL_COMMUNITY)
Admission: RE | Admit: 2021-11-23 | Discharge: 2021-11-23 | Disposition: A | Discharge status: Home / Self Care | Payer: Medicare Other | Source: Ambulatory Visit | Attending: "Endocrinology | Admitting: "Endocrinology

## 2021-11-23 MED ORDER — SODIUM PERTECHNETATE TC 99M INJECTION
5.0000 | Freq: Once | INTRAVENOUS | Status: AC | PRN
  Administered 2021-11-23: 5 via INTRAVENOUS

## 2021-11-30 ENCOUNTER — Ambulatory Visit (INDEPENDENT_AMBULATORY_CARE_PROVIDER_SITE_OTHER): Payer: Medicare Other | Admitting: "Endocrinology

## 2021-11-30 ENCOUNTER — Encounter: Payer: Self-pay | Admitting: "Endocrinology

## 2021-11-30 VITALS — BP 126/72 | HR 96 | Ht 67.0 in | Wt 273.8 lb

## 2021-11-30 DIAGNOSIS — I10 Essential (primary) hypertension: Secondary | ICD-10-CM

## 2021-11-30 DIAGNOSIS — E1159 Type 2 diabetes mellitus with other circulatory complications: Secondary | ICD-10-CM | POA: Diagnosis not present

## 2021-11-30 DIAGNOSIS — E0590 Thyrotoxicosis, unspecified without thyrotoxic crisis or storm: Secondary | ICD-10-CM

## 2021-11-30 DIAGNOSIS — E782 Mixed hyperlipidemia: Secondary | ICD-10-CM | POA: Diagnosis not present

## 2021-11-30 DIAGNOSIS — E559 Vitamin D deficiency, unspecified: Secondary | ICD-10-CM

## 2021-11-30 NOTE — Progress Notes (Signed)
11/30/2021, 4:50 PM  Endocrinology follow-up note   Subjective:    Patient ID: Laurie Palmer, female    DOB: 1946-05-11.  Laurie Palmer is being seen in follow-up with her thyroid uptake and scan.  She was recently seen for the management of her type 2 diabetes.   PMD Monico Blitz, MD.   Past Medical History:  Diagnosis Date   Colon cancer [facility])    Elevated carcinoembryonic antigen (CEA)    Hypercalcemia    Mass of left thigh     Past Surgical History:  Procedure Laterality Date   CATARACT EXTRACTION Bilateral 01/24/2021   IRRIGATION AND DEBRIDEMENT ABSCESS Left 03/25/2015   Procedure: IRRIGATION AND DEBRIDEMENT ABSCESS LEFT THIGH WITH BIOPSIES AND WOUND VAC PLACEMENT;  Surgeon: Hall Busing, MD;  Location: [facility];  Service: General;  Laterality: Left;    Social History   Socioeconomic History   Marital status: Widowed    Spouse name: Not on file   Number of children: Not on file   Years of education: Not on file   Highest education level: Not on file  Occupational History   Not on file  Tobacco Use   Smoking status: Never   Smokeless tobacco: Never  Vaping Use   Vaping Use: Never used  Substance and Sexual Activity   Alcohol use: No    Alcohol/week: 0.0 standard drinks of alcohol   Drug use: No   Sexual activity: Not on file  Other Topics Concern   Not on file  Social History Narrative   Not on file   Social Determinants of Health   Financial Resource Strain: Not on file  Food Insecurity: Not on file  Transportation Needs: Not on file  Physical Activity: Not on file  Stress: Not on file  Social Connections: Not on file    Family History  Problem Relation Age of Onset   Diabetes Mother    Hypertension Mother    Hyperlipidemia Mother    Heart attack Mother    Heart failure Mother    Prostate cancer Father    Prostate cancer Brother    Breast cancer  Neg Hx     Outpatient Encounter Medications as of 11/30/2021  Medication Sig   aspirin (ASPIRIN EC) 81 MG EC tablet Take 81 mg by mouth daily.    Cholecalciferol 50 MCG (2000 UT) CAPS Take 1 capsule (2,000 Units total) by mouth daily with breakfast.   CINNAMON PO Take 1 tablet by mouth daily.   Continuous Blood Gluc Sensor (FREESTYLE LIBRE 14 DAY SENSOR) MISC Inject 1 each into the skin every 14 (fourteen) days. Use as directed.   Dulaglutide (TRULICITY) 3 XK/4.8JE SOPN Inject 3 mg as directed once a week.   insulin aspart (NOVOLOG) 100 UNIT/ML FlexPen Inject 10-16 Units into the skin 3 (three) times daily with meals.   insulin detemir (LEVEMIR) 100 UNIT/ML injection Inject 50 Units into the skin at bedtime.   metFORMIN (GLUCOPHAGE-XR) 500 MG 24 hr tablet Take 2 tablets (1,000 mg total) by mouth daily with breakfast.   pravastatin (PRAVACHOL) 20 MG tablet Take 1 tablet (  20 mg total) by mouth daily.   No facility-administered encounter medications on file as of 11/30/2021.    ALLERGIES: Allergies  Allergen Reactions   Shellfish Allergy Swelling and Anaphylaxis   Lipitor [Atorvastatin] Nausea Only    VACCINATION STATUS: There is no immunization history for the selected administration types on file for this patient.  Diabetes She presents for her follow-up diabetic visit. She has type 2 diabetes mellitus. Onset time: she was diagnosed ta approx age of 24 yrs. Her disease course has been improving. There are no hypoglycemic associated symptoms. Pertinent negatives for diabetes include no polydipsia and no polyuria. There are no hypoglycemic complications. Symptoms are improving. Risk factors for coronary artery disease include dyslipidemia, diabetes mellitus, family history, obesity, hypertension, post-menopausal and sedentary lifestyle. Current diabetic treatments: she is on Lantus 40  units qhs, Novlog 12 -18 units TIDAC. Her weight is fluctuating minimally. She is following a generally  unhealthy diet. When asked about meal planning, she reported none. Her home blood glucose trend is decreasing steadily. Her breakfast blood glucose range is generally 130-140 mg/dl. Her lunch blood glucose range is generally 140-180 mg/dl. Her dinner blood glucose range is generally 140-180 mg/dl. Her overall blood glucose range is 140-180 mg/dl. (She presents with continued improvement in her glycemic profile.  Her freestyle libre AGP shows 75% time in range, 22% with level 1 hyperglycemia, 2% level 2 hyperglycemia.  Her average blood glucose is 148 for the last 14 days.  Her point-of-care A1c is 7.3%, overall improving from 11.7%.   )   During her last visit, she was found to have elevated thyroid hormone levels.  She was sent for thyroid uptake and scan, see below.  Review of Systems  Endocrine: Negative for polydipsia and polyuria.    Objective:       11/30/2021   11:03 AM 11/03/2021    9:35 AM 07/27/2021    8:57 AM  Vitals with BMI  Height '5\' 7"'$  '5\' 11"'$  '5\' 11"'$   Weight 273 lbs 13 oz 276 lbs 13 oz 277 lbs  BMI 42.87 63.78 58.85  Systolic 027 741 287  Diastolic 72 84 72  Pulse 96 72 88    BP 126/72   Pulse 96   Ht '5\' 7"'$  (1.702 m)   Wt 273 lb 12.8 oz (124.2 kg)   BMI 42.88 kg/m   Wt Readings from Last 3 Encounters:  11/30/21 273 lb 12.8 oz (124.2 kg)  11/03/21 276 lb 12.8 oz (125.6 kg)  07/27/21 277 lb (125.6 kg)     Physical Exam   CMP ( most recent) CMP     Component Value Date/Time   NA 142 10/26/2021 1017   K 4.1 10/26/2021 1017   CL 103 10/26/2021 1017   CO2 22 10/26/2021 1017   GLUCOSE 166 (H) 10/26/2021 1017   GLUCOSE 135 (H) 03/26/2015 0540   BUN 11 10/26/2021 1017   CREATININE 0.96 10/26/2021 1017   CALCIUM 10.1 10/26/2021 1017   PROT 6.7 10/26/2021 1017   ALBUMIN 4.0 10/26/2021 1017   AST 23 10/26/2021 1017   ALT 17 10/26/2021 1017   ALKPHOS 101 10/26/2021 1017   BILITOT 0.3 10/26/2021 1017   GFRNONAA 64 01/14/2020 0955   GFRAA 74 01/14/2020 0955      Diabetic Labs (most recent): Lab Results  Component Value Date   HGBA1C 7.3 (A) 11/03/2021   HGBA1C 7.8 (A) 07/27/2021   HGBA1C 7.6 (A) 04/25/2021       Latest Ref Rng &  Units 10/26/2021   10:17 AM 04/18/2021   10:02 AM 08/05/2020    9:12 AM  CMP  Glucose 70 - 99 mg/dL 166  124  142   BUN 8 - 27 mg/dL '11  14  10   '$ Creatinine 0.57 - 1.00 mg/dL 0.96  0.90  0.79   Sodium 134 - 144 mmol/L 142  140  141   Potassium 3.5 - 5.2 mmol/L 4.1  5.3  4.7   Chloride 96 - 106 mmol/L 103  102  100   CO2 20 - 29 mmol/L '22  23  24   '$ Calcium 8.7 - 10.3 mg/dL 10.1  10.6  10.9   Total Protein 6.0 - 8.5 g/dL 6.7  7.1  7.5   Total Bilirubin 0.0 - 1.2 mg/dL 0.3  0.3  0.6   Alkaline Phos 44 - 121 IU/L 101  115  94   AST 0 - 40 IU/L '23  19  16   '$ ALT 0 - 32 IU/L '17  21  15    '$ Lipid Panel     Component Value Date/Time   CHOL 254 (H) 10/26/2021 1017   TRIG 211 (H) 10/26/2021 1017   HDL 43 10/26/2021 1017   CHOLHDL 5.9 (H) 10/26/2021 1017   LDLCALC 172 (H) 10/26/2021 1017   LABVLDL 39 10/26/2021 1017   Thyroid uptake and scan on November 23, 2021.  FINDINGS: Homogeneous uptake in thyroid lobes.   No focal areas of increased or decreased uptake.   4 hour I-131 uptake = 4.9% (normal 5-20%)   24 hour I-131 uptake = 16.5% (normal 10-30%)   IMPRESSION: Normal exam.     Assessment & Plan:   1. DM type 2 causing vascular disease ([place])   - Laurie Palmer has currently uncontrolled symptomatic type 2 DM since  76 years of age.  She presents with continued improvement in her glycemic profile.  Her freestyle libre AGP shows 75% time in range, 22% with level 1 hyperglycemia, 2% level 2 hyperglycemia.  Her average blood glucose is 148 for the last 14 days.  Her point-of-care A1c is 7.3%, overall improving from 11.7%.    - I had a long discussion with her about the progressive nature of diabetes and the pathology behind its complications. -her diabetes is complicated by obesity/sedentary  life, obesity and she remains at a high risk for more acute and chronic complications which include CAD, CVA, CKD, retinopathy, and neuropathy. These are all discussed in detail with her.  - I have counseled her on diet  and weight management  by adopting a carbohydrate restricted/protein rich diet. Patient is encouraged to switch to  unprocessed or minimally processed     complex starch and increased protein intake (animal or plant source), fruits, and vegetables. -  she is advised to stick to a routine mealtimes to eat 3 meals  a day and avoid unnecessary snacks ( to snack only to correct hypoglycemia).  She is benefiting from the slight changes made based on lifestyle medicine. - she acknowledges that there is a room for improvement in her food and drink choices. - Suggestion is made for her to avoid simple carbohydrates  from her diet including Cakes, Sweet Desserts, Ice Cream, Soda (diet and regular), Sweet Tea, Candies, Chips, Cookies, Store Bought Juices, Alcohol , Artificial Sweeteners,  Coffee Creamer, and "Sugar-free" Products, Lemonade. This will help patient to have more stable blood glucose profile and potentially avoid unintended weight gain.  The following Lifestyle Medicine  recommendations according to [place]  [facility]) were discussed and and offered to patient and she  agrees to start the journey:  A. Whole Foods, Plant-Based Nutrition comprising of fruits and vegetables, plant-based proteins, whole-grain carbohydrates was discussed in detail with the patient.   A list for source of those nutrients were also provided to the patient.  Patient will use only water or unsweetened tea for hydration. B.  The need to stay away from risky substances including alcohol, smoking; obtaining 7 to 9 hours of restorative sleep, at least 150 minutes of moderate intensity exercise weekly, the importance of healthy social connections,  and stress management techniques were  discussed. C.  A full color page of  Calorie density of various food groups per pound showing examples of each food groups was provided to the patient.   - she will be scheduled with Jearld Fenton, RDN, CDE for diabetes education.  - I have approached her with the following individualized plan to manage  her diabetes and patient agrees:   -She is advised to continue Lantus 50 units nightly, lower NovoLog to 10  -16  units 3 times a day with meals  for pre-meal BG readings of 90-'150mg'$ /dl, plus patient specific correction dose for unexpected hyperglycemia above '150mg'$ /dl, associated with strict monitoring of glucose 4 times a day-before meals and at bedtime. -Proper insulin dose and administration is reviewed once again with her. -She is encouraged to continue to use her CGM at all times.    - she is warned not to take insulin without proper monitoring per orders. - Adjustment parameters are given to her for hypo and hyperglycemia in writing. - she is encouraged to call clinic for blood glucose levels less than 70 or above 200 mg /dl. - she is  tolerating the extended release metformin, advised to continue Metformin  1000 mg ER p.o. daily at breakfast.   -She is  advised to continue  her Trulicity  3 mg subcutaneously weekly.  - Specific targets for  A1c;  LDL, HDL,  and Triglycerides were discussed with the patient.  2) Blood Pressure /Hypertension:   Her BP is controlled to target. She presents with controlled blood pressure.    3) Lipids/Hyperlipidemia:   Review of her recent lipid panel showed uncontrolled  LDL at 172,  with history of LDL as high as 211 .  She still reports inconsistency taking her statin.  She is supposed to be on pravastatin 20 mg daily, advised to continue.  The above discussed lifestyle medicine recommendations will help significantly with dyslipidemia.       Side effects and precautions discussed with her.  4)  Weight/Diet:  Body mass index is 42.88 kg/m.  -    clearly complicating her diabetes care.   she is  a candidate for weight loss. I discussed with her the fact that loss of 5 - 10% of her  current body weight will have the most impact on her diabetes management.  Exercise, and detailed carbohydrates information provided  -  detailed on discharge instructions.  5) vitamin D deficiency:  She is currently on ongoing ergocalciferol 50,000 units weekly for 12 weeks. -After she finished her high-dose supplement, she will continue vitamin D3 2000 units daily for maintenance. Her labs show persistent mild hypercalcemia of 10.9, however improving PTH of 58.  This is still possibly due to early mild primary hyperparathyroidism.    She will continue to need follow-up with PTH/calcium and 24-hour urine  calcium study on subsequent visits, after her vitamin D is corrected.  6) abnormal thyroid function tests:  This visit was mainly for follow-up of her recent thyroid uptake and scan ordered due to abnormally high thyroid hormone levels and suppressed TSH.  Her uptake is normal, she will not need ablative treatment for now.  She will be considered for repeat thyroid function test before her next visit.     7) Chronic Care/Health Maintenance:  -she  Is non Statin medications and  is encouraged to initiate and continue to follow up with Ophthalmology, Dentist,  Podiatrist at least yearly or according to recommendations, and advised to   stay away from smoking. I have recommended yearly flu vaccine and pneumonia vaccine at least every 5 years; moderate intensity exercise for up to 150 minutes weekly; and  sleep for at least 7 hours a day.   Her screening ABI was negative for PAD in March 2022.  Her neck study will be in March 2027 or sooner if needed.    - she is  advised to maintain close follow up with Monico Blitz, MD for primary care needs, as well as her other providers for optimal and coordinated care.  I spent 21 minutes in the care of the patient today  including review of labs from Thyroid Function, CMP, and other relevant labs ; imaging/biopsy records (current and previous including abstractions from other facilities); face-to-face time discussing  her lab results and symptoms, medications doses, her options of short and long term treatment based on the latest standards of care / guidelines;   and documenting the encounter.  Laurie Palmer  participated in the discussions, expressed understanding, and voiced agreement with the above plans.  All questions were answered to her satisfaction. she is encouraged to contact clinic should she have any questions or concerns prior to her return visit.    Follow up plan: - Return in about 3 months (around 03/02/2022) for Meter/CGM/Logs, A1c here, F/U with Pre-visit Labs, Meter/CGM/Logs, A1c here.  Glade Lloyd, MD [facility] [address] [place], [place] [address] Phone: [phone]  Fax: [phone]    11/30/2021, 4:50 PM  This note was partially dictated with voice recognition software. Similar sounding words can be transcribed inadequately or may not  be corrected upon review.

## 2021-12-29 ENCOUNTER — Other Ambulatory Visit: Payer: Self-pay

## 2021-12-29 DIAGNOSIS — E1159 Type 2 diabetes mellitus with other circulatory complications: Secondary | ICD-10-CM

## 2021-12-29 MED ORDER — FREESTYLE LIBRE 2 SENSOR MISC: 2 refills | Status: DC

## 2021-12-29 MED ORDER — FREESTYLE LIBRE 2 READER DEVI: 0 refills | Status: DC

## 2022-01-18 ENCOUNTER — Telehealth: Payer: Self-pay | Admitting: "Endocrinology

## 2022-01-18 ENCOUNTER — Other Ambulatory Visit: Payer: Self-pay

## 2022-01-18 DIAGNOSIS — E1159 Type 2 diabetes mellitus with other circulatory complications: Secondary | ICD-10-CM

## 2022-01-18 MED ORDER — INSULIN ASPART 100 UNIT/ML FLEXPEN: 10.0000 [IU] | PEN_INJECTOR | Freq: Three times a day (TID) | SUBCUTANEOUS | 2 refills | Status: DC

## 2022-01-18 NOTE — Telephone Encounter (Signed)
Pt said that her Novolog is now costing 70.00 due to the units she is taking on RX. She said she has always paid 35.00. She said it is suppose to be 10-15 units, she believes. Can you look into this? She uses [facility]. Call pt/.

## 2022-01-18 NOTE — Telephone Encounter (Signed)
Spoke with pharmacist, changed instructions for novolog to max of 50 units per day per Dr.Nida's orders.

## 2022-03-02 LAB — TSH: TSH: 1.89 u[IU]/mL (ref 0.450–4.500)

## 2022-03-02 LAB — THYROID PEROXIDASE ANTIBODY: Thyroperoxidase Ab SerPl-aCnc: 216 IU/mL — ABNORMAL HIGH (ref 0–34)

## 2022-03-02 LAB — T4, FREE: Free T4: 1.16 ng/dL (ref 0.82–1.77)

## 2022-03-02 LAB — THYROGLOBULIN ANTIBODY: Thyroglobulin Antibody: 1.3 IU/mL — ABNORMAL HIGH (ref 0.0–0.9)

## 2022-03-02 LAB — T3, FREE: T3, Free: 2.3 pg/mL (ref 2.0–4.4)

## 2022-03-08 ENCOUNTER — Ambulatory Visit (INDEPENDENT_AMBULATORY_CARE_PROVIDER_SITE_OTHER): Payer: Medicare Other | Admitting: "Endocrinology

## 2022-03-08 ENCOUNTER — Telehealth: Payer: Self-pay

## 2022-03-08 ENCOUNTER — Encounter: Payer: Self-pay | Admitting: "Endocrinology

## 2022-03-08 VITALS — BP 138/84 | HR 68 | Ht 67.0 in | Wt 281.6 lb

## 2022-03-08 DIAGNOSIS — E559 Vitamin D deficiency, unspecified: Secondary | ICD-10-CM | POA: Diagnosis not present

## 2022-03-08 DIAGNOSIS — I10 Essential (primary) hypertension: Secondary | ICD-10-CM | POA: Diagnosis not present

## 2022-03-08 DIAGNOSIS — E782 Mixed hyperlipidemia: Secondary | ICD-10-CM | POA: Diagnosis not present

## 2022-03-08 DIAGNOSIS — E1159 Type 2 diabetes mellitus with other circulatory complications: Secondary | ICD-10-CM | POA: Diagnosis not present

## 2022-03-08 LAB — POCT GLYCOSYLATED HEMOGLOBIN (HGB A1C): HbA1c, POC (controlled diabetic range): 8.1 % — AB (ref 0.0–7.0)

## 2022-03-08 MED ORDER — FREESTYLE LIBRE 2 SENSOR MISC: 2 refills | Status: AC

## 2022-03-08 MED ORDER — INSULIN ASPART 100 UNIT/ML FLEXPEN: 12.0000 [IU] | PEN_INJECTOR | Freq: Three times a day (TID) | SUBCUTANEOUS | 1 refills | Status: DC

## 2022-03-08 MED ORDER — FREESTYLE LIBRE 2 READER DEVI: 0 refills | Status: AC

## 2022-03-08 NOTE — Patient Instructions (Signed)

## 2022-03-08 NOTE — Progress Notes (Signed)
03/08/2022, 1:00 PM  Endocrinology follow-up note   Subjective:    Patient ID: Laurie Palmer, female    DOB: 1945-11-26.  Laurie Palmer is being seen in follow-up with her thyroid uptake and scan.  She was recently seen for the management of her type 2 diabetes.   PMD Monico Blitz, MD.   Past Medical History:  Diagnosis Date   Colon cancer [facility])    Elevated carcinoembryonic antigen (CEA)    Hypercalcemia    Mass of left thigh     Past Surgical History:  Procedure Laterality Date   CATARACT EXTRACTION Bilateral 01/24/2021   IRRIGATION AND DEBRIDEMENT ABSCESS Left 03/25/2015   Procedure: IRRIGATION AND DEBRIDEMENT ABSCESS LEFT THIGH WITH BIOPSIES AND WOUND VAC PLACEMENT;  Surgeon: Hall Busing, MD;  Location: [facility];  Service: General;  Laterality: Left;    Social History   Socioeconomic History   Marital status: Widowed    Spouse name: Not on file   Number of children: Not on file   Years of education: Not on file   Highest education level: Not on file  Occupational History   Not on file  Tobacco Use   Smoking status: Never   Smokeless tobacco: Never  Vaping Use   Vaping Use: Never used  Substance and Sexual Activity   Alcohol use: No    Alcohol/week: 0.0 standard drinks of alcohol   Drug use: No   Sexual activity: Not on file  Other Topics Concern   Not on file  Social History Narrative   Not on file   Social Determinants of Health   Financial Resource Strain: Not on file  Food Insecurity: Not on file  Transportation Needs: Not on file  Physical Activity: Not on file  Stress: Not on file  Social Connections: Not on file    Family History  Problem Relation Age of Onset   Diabetes Mother    Hypertension Mother    Hyperlipidemia Mother    Heart attack Mother    Heart failure Mother    Prostate cancer Father    Prostate cancer Brother    Breast cancer  Neg Hx     Outpatient Encounter Medications as of 03/08/2022  Medication Sig   aspirin (ASPIRIN EC) 81 MG EC tablet Take 81 mg by mouth daily.    Cholecalciferol 50 MCG (2000 UT) CAPS Take 1 capsule (2,000 Units total) by mouth daily with breakfast.   CINNAMON PO Take 1 tablet by mouth daily.   Continuous Blood Gluc Receiver (FREESTYLE LIBRE 2 READER) DEVI Use to check glucose as directed   Continuous Blood Gluc Sensor (FREESTYLE LIBRE 2 SENSOR) MISC Change sensor every 14 days   Dulaglutide (TRULICITY) 3 BV/6.9IH SOPN Inject 3 mg as directed once a week.   insulin aspart (NOVOLOG) 100 UNIT/ML FlexPen Inject 12-18 Units into the skin 3 (three) times daily with meals. MAX OF 50 UNITS PER DAY   insulin detemir (LEVEMIR) 100 UNIT/ML injection Inject 50 Units into the skin at bedtime.   metFORMIN (GLUCOPHAGE-XR) 500 MG 24 hr tablet Take 2 tablets (1,000 mg total) by  mouth daily with breakfast.   pravastatin (PRAVACHOL) 20 MG tablet Take 1 tablet (20 mg total) by mouth daily.   [DISCONTINUED] Continuous Blood Gluc Receiver (FREESTYLE LIBRE 2 READER) DEVI Use to check glucose as directed   [DISCONTINUED] Continuous Blood Gluc Sensor (FREESTYLE LIBRE 2 SENSOR) MISC Change sensor every 14 days   [DISCONTINUED] insulin aspart (NOVOLOG) 100 UNIT/ML FlexPen Inject 10-16 Units into the skin 3 (three) times daily with meals. MAX OF 50 UNITS PER DAY   No facility-administered encounter medications on file as of 03/08/2022.    ALLERGIES: Allergies  Allergen Reactions   Shellfish Allergy Swelling and Anaphylaxis   Lipitor [Atorvastatin] Nausea Only    VACCINATION STATUS: There is no immunization history for the selected administration types on file for this patient.  Diabetes She presents for her follow-up diabetic visit. She has type 2 diabetes mellitus. Onset time: she was diagnosed ta approx age of 39 yrs. Her disease course has been worsening. There are no hypoglycemic associated symptoms.  Pertinent negatives for diabetes include no polydipsia and no polyuria. There are no hypoglycemic complications. Symptoms are worsening. Risk factors for coronary artery disease include dyslipidemia, diabetes mellitus, family history, obesity, hypertension, post-menopausal and sedentary lifestyle. Current diabetic treatments: she is on Lantus 40  units qhs, Novlog 12 -18 units TIDAC. Her weight is increasing steadily. She is following a generally unhealthy diet. When asked about meal planning, she reported none. Her home blood glucose trend is increasing steadily. Her breakfast blood glucose range is generally 130-140 mg/dl. Her lunch blood glucose range is generally 140-180 mg/dl. Her dinner blood glucose range is generally 140-180 mg/dl. Her overall blood glucose range is 140-180 mg/dl. (She presents with significantly above target postprandial hyperglycemia.  Her freestyle libre device was analyzed showing 61% time range, 27% level 1 hyperglycemia, 3% average 2 hyperglycemia.  Her point-of-care A1c is 8.1%, increasing from 7.3% during her last visit.  However, overall improving from 11.7%.  She did not document significant hypoglycemia.    )   During her last visit, she was found to have elevated thyroid hormone levels.  She was sent for thyroid uptake and scan, see below.  Review of Systems  Endocrine: Negative for polydipsia and polyuria.    Objective:       03/08/2022    9:17 AM 11/30/2021   11:03 AM 11/03/2021    9:35 AM  Vitals with BMI  Height '5\' 7"'$  '5\' 7"'$  '5\' 11"'$   Weight 281 lbs 10 oz 273 lbs 13 oz 276 lbs 13 oz  BMI 44.09 24.40 10.27  Systolic 253 664 403  Diastolic 84 72 84  Pulse 68 96 72    BP 138/84   Pulse 68   Ht '5\' 7"'$  (1.702 m)   Wt 281 lb 9.6 oz (127.7 kg)   BMI 44.10 kg/m   Wt Readings from Last 3 Encounters:  03/08/22 281 lb 9.6 oz (127.7 kg)  11/30/21 273 lb 12.8 oz (124.2 kg)  11/03/21 276 lb 12.8 oz (125.6 kg)     Physical Exam   CMP ( most recent) CMP      Component Value Date/Time   NA 142 10/26/2021 1017   K 4.1 10/26/2021 1017   CL 103 10/26/2021 1017   CO2 22 10/26/2021 1017   GLUCOSE 166 (H) 10/26/2021 1017   GLUCOSE 135 (H) 03/26/2015 0540   BUN 11 10/26/2021 1017   CREATININE 0.96 10/26/2021 1017   CALCIUM 10.1 10/26/2021 1017   PROT 6.7 10/26/2021 1017  ALBUMIN 4.0 10/26/2021 1017   AST 23 10/26/2021 1017   ALT 17 10/26/2021 1017   ALKPHOS 101 10/26/2021 1017   BILITOT 0.3 10/26/2021 1017   GFRNONAA 64 01/14/2020 0955   GFRAA 74 01/14/2020 0955     Diabetic Labs (most recent): Lab Results  Component Value Date   HGBA1C 8.1 (A) 03/08/2022   HGBA1C 7.3 (A) 11/03/2021   HGBA1C 7.8 (A) 07/27/2021       Latest Ref Rng & Units 10/26/2021   10:17 AM 04/18/2021   10:02 AM 08/05/2020    9:12 AM  CMP  Glucose 70 - 99 mg/dL 166  124  142   BUN 8 - 27 mg/dL '11  14  10   '$ Creatinine 0.57 - 1.00 mg/dL 0.96  0.90  0.79   Sodium 134 - 144 mmol/L 142  140  141   Potassium 3.5 - 5.2 mmol/L 4.1  5.3  4.7   Chloride 96 - 106 mmol/L 103  102  100   CO2 20 - 29 mmol/L '22  23  24   '$ Calcium 8.7 - 10.3 mg/dL 10.1  10.6  10.9   Total Protein 6.0 - 8.5 g/dL 6.7  7.1  7.5   Total Bilirubin 0.0 - 1.2 mg/dL 0.3  0.3  0.6   Alkaline Phos 44 - 121 IU/L 101  115  94   AST 0 - 40 IU/L '23  19  16   '$ ALT 0 - 32 IU/L '17  21  15    '$ Lipid Panel     Component Value Date/Time   CHOL 254 (H) 10/26/2021 1017   TRIG 211 (H) 10/26/2021 1017   HDL 43 10/26/2021 1017   CHOLHDL 5.9 (H) 10/26/2021 1017   LDLCALC 172 (H) 10/26/2021 1017   LABVLDL 39 10/26/2021 1017   Thyroid uptake and scan on November 23, 2021.  FINDINGS: Homogeneous uptake in thyroid lobes.   No focal areas of increased or decreased uptake.   4 hour I-131 uptake = 4.9% (normal 5-20%)   24 hour I-131 uptake = 16.5% (normal 10-30%)   IMPRESSION: Normal exam.     Assessment & Plan:   1. DM type 2 causing vascular disease ([place])   - Laurie Palmer has currently  uncontrolled symptomatic type 2 DM since  76 years of age.  She presents with significantly above target postprandial hyperglycemia.  Her freestyle libre device was analyzed showing 61% time range, 27% level 1 hyperglycemia, 3% average 2 hyperglycemia.  Her point-of-care A1c is 8.1%, increasing from 7.3% during her last visit.  However, overall improving from 11.7%.  She did not document significant hypoglycemia.   - I had a long discussion with her about the progressive nature of diabetes and the pathology behind its complications. -her diabetes is complicated by obesity/sedentary life, obesity and she remains at a high risk for more acute and chronic complications which include CAD, CVA, CKD, retinopathy, and neuropathy. These are all discussed in detail with her.  - I have counseled her on diet  and weight management  by adopting a carbohydrate restricted/protein rich diet. Patient is encouraged to switch to  unprocessed or minimally processed     complex starch and increased protein intake (animal or plant source), fruits, and vegetables. -  she is advised to stick to a routine mealtimes to eat 3 meals  a day and avoid unnecessary snacks ( to snack only to correct hypoglycemia).  She is benefiting from the slight changes made based  on lifestyle medicine. - she acknowledges that there is a room for improvement in her food and drink choices. - she acknowledges that there is a room for improvement in her food and drink choices. - Suggestion is made for her to avoid simple carbohydrates  from her diet including Cakes, Sweet Desserts, Ice Cream, Soda (diet and regular), Sweet Tea, Candies, Chips, Cookies, Store Bought Juices, Alcohol , Artificial Sweeteners,  Coffee Creamer, and "Sugar-free" Products, Lemonade. This will help patient to have more stable blood glucose profile and potentially avoid unintended weight gain.  The following Lifestyle Medicine recommendations according to [place]  [facility]) were discussed and and offered to patient and she  agrees to start the journey:  A. Whole Foods, Plant-Based Nutrition comprising of fruits and vegetables, plant-based proteins, whole-grain carbohydrates was discussed in detail with the patient.   A list for source of those nutrients were also provided to the patient.  Patient will use only water or unsweetened tea for hydration. B.  The need to stay away from risky substances including alcohol, smoking; obtaining 7 to 9 hours of restorative sleep, at least 150 minutes of moderate intensity exercise weekly, the importance of healthy social connections,  and stress management techniques were discussed. C.  A full color page of  Calorie density of various food groups per pound showing examples of each food groups was provided to the patient.   - she will be scheduled with Jearld Fenton, RDN, CDE for diabetes education.  - I have approached her with the following individualized plan to manage  her diabetes and patient agrees:   -She is advised to continue Lantus 50 units nightly, increase NovoLog to 12 -18  units 3 times a day with meals  for pre-meal BG readings of 90-'150mg'$ /dl, plus patient specific correction dose for unexpected hyperglycemia above '150mg'$ /dl, associated with strict monitoring of glucose 4 times a day-before meals and at bedtime. -Proper insulin dose and administration is reviewed once again with her. -She is encouraged to continue to use her CGM at all times.    - she is warned not to take insulin without proper monitoring per orders. - Adjustment parameters are given to her for hypo and hyperglycemia in writing. - she is encouraged to call clinic for blood glucose levels less than 70 or above 200 mg /dl. - she is tolerating extended release metformin tolerating extended release metformin, advised to continue metformin 1000 mg ER p.o. daily with breakfast.    -She is  advised to continue  her Trulicity   3 mg subcutaneously weekly.  - Specific targets for  A1c;  LDL, HDL,  and Triglycerides were discussed with the patient.  2) Blood Pressure /Hypertension:  -Her blood pressure is controlled to target. She presents with controlled blood pressure.    3) Lipids/Hyperlipidemia:   Review of her recent lipid panel showed uncontrolled  LDL at 172,  with history of LDL as high as 211 .  Despite advised during her last visit, she is still consistent with pravastatin 20 mg p.o. nightly.  She is advised to resume this medication.  She will have previsit fasting lipid panel before her next visit.  She is also encouraged to engage better with whole food plant-based diet.   4)  Weight/Diet:  Body mass index is 44.1 kg/m.  -   clearly complicating her diabetes care.   she is  a candidate for weight loss. I discussed with her the fact that loss  of 5 - 10% of her  current body weight will have the most impact on her diabetes management.  Exercise, and detailed carbohydrates information provided  -  detailed on discharge instructions.  5) vitamin D deficiency:  She is currently on ongoing ergocalciferol 50,000 units weekly for 12 weeks. -After she finished her high-dose supplement, she will continue vitamin D3 2000 units daily for maintenance. Her labs show persistent mild hypercalcemia of 10.9, however improving PTH of 58.  This is still possibly due to early mild primary hyperparathyroidism.    She will continue to need follow-up with PTH/calcium and 24-hour urine calcium study on subsequent visits, after her vitamin D is corrected.  6) abnormal thyroid function tests:  This visit was mainly for follow-up of her recent thyroid uptake and scan ordered due to abnormally high thyroid hormone levels and suppressed TSH.  Her uptake is normal, she will not need ablative treatment for now.  She will be considered for repeat thyroid function test before her next visit.     7) Chronic Care/Health Maintenance:  -she   Is non Statin medications and  is encouraged to initiate and continue to follow up with Ophthalmology, Dentist,  Podiatrist at least yearly or according to recommendations, and advised to   stay away from smoking. I have recommended yearly flu vaccine and pneumonia vaccine at least every 5 years; moderate intensity exercise for up to 150 minutes weekly; and  sleep for at least 7 hours a day.   Her screening ABI was negative for PAD in March 2022.  Her neck study will be in March 2027 or sooner if needed.    - she is  advised to maintain close follow up with Monico Blitz, MD for primary care needs, as well as her other providers for optimal and coordinated care.   I spent 41 minutes in the care of the patient today including review of labs from [facility], Lipids, Thyroid Function, Hematology (current and previous including abstractions from other facilities); face-to-face time discussing  her blood glucose readings/logs, discussing hypoglycemia and hyperglycemia episodes and symptoms, medications doses, her options of short and long term treatment based on the latest standards of care / guidelines;  discussion about incorporating lifestyle medicine;  and documenting the encounter. Risk reduction counseling performed per USPSTF guidelines to reduce obesity and cardiovascular risk factors.     Please refer to Patient Instructions for Blood Glucose Monitoring and Insulin/Medications Dosing Guide"  in media tab for additional information. Please  also refer to " Patient Self Inventory" in the Media  tab for reviewed elements of pertinent patient history.  Laurie Palmer participated in the discussions, expressed understanding, and voiced agreement with the above plans.  All questions were answered to her satisfaction. she is encouraged to contact clinic should she have any questions or concerns prior to her return visit.    Follow up plan: - Return in about 4 months (around 07/09/2022) for F/U with  Pre-visit Labs, Meter/CGM/Logs, A1c here.  Glade Lloyd, MD [facility] [address] [place], [place] [address] Phone: [phone]  Fax: [phone]    03/08/2022, 1:00 PM  This note was partially dictated with voice recognition software. Similar sounding words can be transcribed inadequately or may not  be corrected upon review.

## 2022-03-08 NOTE — Telephone Encounter (Signed)
Sent order for [employer] 2 CGM system to [employer].

## 2022-05-04 ENCOUNTER — Other Ambulatory Visit: Payer: Self-pay

## 2022-05-04 MED ORDER — TRULICITY 3 MG/0.5ML ~~LOC~~ SOAJ: 3.0000 mg | SUBCUTANEOUS | 3 refills | Status: DC

## 2022-07-05 LAB — COMPREHENSIVE METABOLIC PANEL
ALT: 17 IU/L (ref 0–32)
AST: 21 IU/L (ref 0–40)
Albumin/Globulin Ratio: 1.3 (ref 1.2–2.2)
Albumin: 4 g/dL (ref 3.8–4.8)
Alkaline Phosphatase: 110 IU/L (ref 44–121)
BUN/Creatinine Ratio: 12 (ref 12–28)
BUN: 11 mg/dL (ref 8–27)
Bilirubin Total: 0.3 mg/dL (ref 0.0–1.2)
CO2: 23 mmol/L (ref 20–29)
Calcium: 10.2 mg/dL (ref 8.7–10.3)
Chloride: 101 mmol/L (ref 96–106)
Creatinine, Ser: 0.91 mg/dL (ref 0.57–1.00)
Globulin, Total: 3.1 g/dL (ref 1.5–4.5)
Glucose: 152 mg/dL — ABNORMAL HIGH (ref 70–99)
Potassium: 5.5 mmol/L — ABNORMAL HIGH (ref 3.5–5.2)
Sodium: 138 mmol/L (ref 134–144)
Total Protein: 7.1 g/dL (ref 6.0–8.5)
eGFR: 65 mL/min/{1.73_m2} (ref >59)

## 2022-07-05 LAB — LIPID PANEL
Chol/HDL Ratio: 6.2 ratio — ABNORMAL HIGH (ref 0.0–4.4)
Cholesterol, Total: 256 mg/dL — ABNORMAL HIGH (ref 100–199)
HDL: 41 mg/dL (ref >39)
LDL Chol Calc (NIH): 161 mg/dL — ABNORMAL HIGH (ref 0–99)
Triglycerides: 285 mg/dL — ABNORMAL HIGH (ref 0–149)
VLDL Cholesterol Cal: 54 mg/dL — ABNORMAL HIGH (ref 5–40)

## 2022-07-05 LAB — TSH: TSH: 2.73 u[IU]/mL (ref 0.450–4.500)

## 2022-07-05 LAB — T4, FREE: Free T4: 1.15 ng/dL (ref 0.82–1.77)

## 2022-07-11 ENCOUNTER — Encounter: Payer: Self-pay | Admitting: "Endocrinology

## 2022-07-11 ENCOUNTER — Ambulatory Visit (INDEPENDENT_AMBULATORY_CARE_PROVIDER_SITE_OTHER): Payer: Medicare Other | Admitting: "Endocrinology

## 2022-07-11 VITALS — BP 130/78 | HR 68 | Ht 67.0 in | Wt 281.6 lb

## 2022-07-11 DIAGNOSIS — E1159 Type 2 diabetes mellitus with other circulatory complications: Secondary | ICD-10-CM

## 2022-07-11 DIAGNOSIS — I10 Essential (primary) hypertension: Secondary | ICD-10-CM | POA: Diagnosis not present

## 2022-07-11 DIAGNOSIS — E559 Vitamin D deficiency, unspecified: Secondary | ICD-10-CM | POA: Diagnosis not present

## 2022-07-11 DIAGNOSIS — E782 Mixed hyperlipidemia: Secondary | ICD-10-CM | POA: Diagnosis not present

## 2022-07-11 LAB — POCT GLYCOSYLATED HEMOGLOBIN (HGB A1C): HbA1c, POC (controlled diabetic range): 8.9 % — AB (ref 0.0–7.0)

## 2022-07-11 MED ORDER — TRULICITY 4.5 MG/0.5ML ~~LOC~~ SOAJ: 4.5000 mg | SUBCUTANEOUS | 2 refills | Status: DC

## 2022-07-11 MED ORDER — INSULIN ASPART 100 UNIT/ML FLEXPEN: 14.0000 [IU] | PEN_INJECTOR | Freq: Three times a day (TID) | SUBCUTANEOUS | 1 refills | Status: DC

## 2022-07-11 NOTE — Progress Notes (Signed)
07/11/2022, 10:54 AM  Endocrinology follow-up note   Subjective:    Patient ID: Laurie Palmer, female    DOB: 09/14/1945.  Laurie Palmer is being seen in follow-up with her thyroid uptake and scan.  She was recently seen for the management of her type 2 diabetes.   PMD Monico Blitz, MD.   Past Medical History:  Diagnosis Date   Colon cancer [facility])    Elevated carcinoembryonic antigen (CEA)    Gout    Hypercalcemia    Mass of left thigh     Past Surgical History:  Procedure Laterality Date   CATARACT EXTRACTION Bilateral 01/24/2021   IRRIGATION AND DEBRIDEMENT ABSCESS Left 03/25/2015   Procedure: IRRIGATION AND DEBRIDEMENT ABSCESS LEFT THIGH WITH BIOPSIES AND WOUND VAC PLACEMENT;  Surgeon: Hall Busing, MD;  Location: [facility];  Service: General;  Laterality: Left;    Social History   Socioeconomic History   Marital status: Widowed    Spouse name: Not on file   Number of children: Not on file   Years of education: Not on file   Highest education level: Not on file  Occupational History   Not on file  Tobacco Use   Smoking status: Never   Smokeless tobacco: Never  Vaping Use   Vaping Use: Never used  Substance and Sexual Activity   Alcohol use: No    Alcohol/week: 0.0 standard drinks of alcohol   Drug use: No   Sexual activity: Not on file  Other Topics Concern   Not on file  Social History Narrative   Not on file   Social Determinants of Health   Financial Resource Strain: Not on file  Food Insecurity: Not on file  Transportation Needs: Not on file  Physical Activity: Not on file  Stress: Not on file  Social Connections: Not on file    Family History  Problem Relation Age of Onset   Diabetes Mother    Hypertension Mother    Hyperlipidemia Mother    Heart attack Mother    Heart failure Mother    Prostate cancer Father    Prostate cancer Brother    Breast  cancer Neg Hx     Outpatient Encounter Medications as of 07/11/2022  Medication Sig   Dulaglutide (TRULICITY) 4.5 0000000 SOPN Inject 4.5 mg as directed once a week.   HYDROcodone-acetaminophen (NORCO/VICODIN) 5-325 MG tablet Take 1 tablet by mouth every 4 (four) hours as needed.   aspirin (ASPIRIN EC) 81 MG EC tablet Take 81 mg by mouth daily.    Cholecalciferol 50 MCG (2000 UT) CAPS Take 1 capsule (2,000 Units total) by mouth daily with breakfast.   CINNAMON PO Take 1 tablet by mouth daily.   Continuous Blood Gluc Receiver (FREESTYLE LIBRE 2 READER) DEVI Use to check glucose as directed   Continuous Blood Gluc Sensor (FREESTYLE LIBRE 2 SENSOR) MISC Change sensor every 14 days   ibuprofen (ADVIL) 800 MG tablet Take 800 mg by mouth every 8 (eight) hours as needed.   insulin aspart (NOVOLOG) 100 UNIT/ML FlexPen Inject 14-20 Units into the skin 3 (three) times daily  with meals. MAX OF 50 UNITS PER DAY   insulin detemir (LEVEMIR) 100 UNIT/ML injection Inject 50 Units into the skin at bedtime.   metFORMIN (GLUCOPHAGE-XR) 500 MG 24 hr tablet Take 2 tablets (1,000 mg total) by mouth daily with breakfast.   pravastatin (PRAVACHOL) 20 MG tablet Take 1 tablet (20 mg total) by mouth daily.   [DISCONTINUED] Dulaglutide (TRULICITY) 3 0000000 SOPN Inject 3 mg as directed once a week.   [DISCONTINUED] insulin aspart (NOVOLOG) 100 UNIT/ML FlexPen Inject 12-18 Units into the skin 3 (three) times daily with meals. MAX OF 50 UNITS PER DAY   No facility-administered encounter medications on file as of 07/11/2022.    ALLERGIES: Allergies  Allergen Reactions   Shellfish Allergy Swelling and Anaphylaxis   Lipitor [Atorvastatin] Nausea Only    VACCINATION STATUS: There is no immunization history for the selected administration types on file for this patient.  Diabetes She presents for her follow-up diabetic visit. She has type 2 diabetes mellitus. Onset time: she was diagnosed ta approx age of 5 yrs. Her  disease course has been worsening. There are no hypoglycemic associated symptoms. Pertinent negatives for diabetes include no polydipsia and no polyuria. There are no hypoglycemic complications. Symptoms are worsening. Risk factors for coronary artery disease include dyslipidemia, diabetes mellitus, family history, obesity, hypertension, post-menopausal and sedentary lifestyle. Current diabetic treatments: she is on Lantus 40  units qhs, Novlog 12 -18 units TIDAC. Her weight is fluctuating minimally. She is following a generally unhealthy diet. When asked about meal planning, she reported none. Her home blood glucose trend is increasing steadily. Her breakfast blood glucose range is generally 180-200 mg/dl. Her lunch blood glucose range is generally 180-200 mg/dl. Her dinner blood glucose range is generally 180-200 mg/dl. Her bedtime blood glucose range is generally 140-180 mg/dl. Her overall blood glucose range is 180-200 mg/dl. (She presents with significantly worsening glycemic profile with point-of-care A1c of 8.9%.  Her AGP report shows 49% time in range, 23% level 1 hyperglycemia, 27% level 2 hyperglycemia.  She did not document hypoglycemia.     )   During her last visit, she was found to have elevated thyroid hormone levels.  She was sent for thyroid uptake and scan, see below.  Review of Systems  Endocrine: Negative for polydipsia and polyuria.    Objective:       07/11/2022    9:20 AM 03/08/2022    9:17 AM 11/30/2021   11:03 AM  Vitals with BMI  Height '5\' 7"'$  '5\' 7"'$  '5\' 7"'$   Weight 281 lbs 10 oz 281 lbs 10 oz 273 lbs 13 oz  BMI 44.09 0000000 [id number]  Systolic AB-123456789 0000000 [id number]  Diastolic 78 84 72  Pulse 68 68 96    BP 130/78   Pulse 68   Ht '5\' 7"'$  (1.702 m)   Wt 281 lb 9.6 oz (127.7 kg)   BMI 44.10 kg/m   Wt Readings from Last 3 Encounters:  07/11/22 281 lb 9.6 oz (127.7 kg)  03/08/22 281 lb 9.6 oz (127.7 kg)  11/30/21 273 lb 12.8 oz (124.2 kg)     Physical Exam   CMP ( most  recent) CMP     Component Value Date/Time   NA 138 07/04/2022 1005   K 5.5 (H) 07/04/2022 1005   CL 101 07/04/2022 1005   CO2 23 07/04/2022 1005   GLUCOSE 152 (H) 07/04/2022 1005   GLUCOSE 135 (H) 03/26/2015 0540   BUN 11 07/04/2022 1005   CREATININE 0.91  07/04/2022 1005   CALCIUM 10.2 07/04/2022 1005   PROT 7.1 07/04/2022 1005   ALBUMIN 4.0 07/04/2022 1005   AST 21 07/04/2022 1005   ALT 17 07/04/2022 1005   ALKPHOS 110 07/04/2022 1005   BILITOT 0.3 07/04/2022 1005   GFRNONAA 64 01/14/2020 0955   GFRAA 74 01/14/2020 0955     Diabetic Labs (most recent): Lab Results  Component Value Date   HGBA1C 8.9 (A) 07/11/2022   HGBA1C 8.1 (A) 03/08/2022   HGBA1C 7.3 (A) 11/03/2021       Latest Ref Rng & Units 07/04/2022   10:05 AM 10/26/2021   10:17 AM 04/18/2021   10:02 AM  CMP  Glucose 70 - 99 mg/dL 152  166  124   BUN 8 - 27 mg/dL '11  11  14   '$ Creatinine 0.57 - 1.00 mg/dL 0.91  0.96  0.90   Sodium 134 - 144 mmol/L 138  142  140   Potassium 3.5 - 5.2 mmol/L 5.5  4.1  5.3   Chloride 96 - 106 mmol/L 101  103  102   CO2 20 - 29 mmol/L '23  22  23   '$ Calcium 8.7 - 10.3 mg/dL 10.2  10.1  10.6   Total Protein 6.0 - 8.5 g/dL 7.1  6.7  7.1   Total Bilirubin 0.0 - 1.2 mg/dL 0.3  0.3  0.3   Alkaline Phos 44 - 121 IU/L 110  101  115   AST 0 - 40 IU/L '21  23  19   '$ ALT 0 - 32 IU/L '17  17  21    '$ Lipid Panel     Component Value Date/Time   CHOL 256 (H) 07/04/2022 1005   TRIG 285 (H) 07/04/2022 1005   HDL 41 07/04/2022 1005   CHOLHDL 6.2 (H) 07/04/2022 1005   LDLCALC 161 (H) 07/04/2022 1005   LABVLDL 54 (H) 07/04/2022 1005   Thyroid uptake and scan on November 23, 2021.  FINDINGS: Homogeneous uptake in thyroid lobes.   No focal areas of increased or decreased uptake.   4 hour I-131 uptake = 4.9% (normal 5-20%)   24 hour I-131 uptake = 16.5% (normal 10-30%)   IMPRESSION: Normal exam.     Assessment & Plan:   1. DM type 2 causing vascular disease ([place])   - Felecia Shelling  [place] has currently uncontrolled symptomatic type 2 DM since  77 years of age. She presents with significantly worsening glycemic profile with point-of-care A1c of 8.9%.  Her AGP report shows 49% time in range, 23% level 1 hyperglycemia, 27% level 2 hyperglycemia.  She did not document hypoglycemia.    - I had a long discussion with her about the progressive nature of diabetes and the pathology behind its complications. -her diabetes is complicated by obesity/sedentary life, obesity and she remains at a high risk for more acute and chronic complications which include CAD, CVA, CKD, retinopathy, and neuropathy. These are all discussed in detail with her.  - I have counseled her on diet  and weight management  by adopting a carbohydrate restricted/protein rich diet. Patient is encouraged to switch to  unprocessed or minimally processed     complex starch and increased protein intake (animal or plant source), fruits, and vegetables. -  she is advised to stick to a routine mealtimes to eat 3 meals  a day and avoid unnecessary snacks ( to snack only to correct hypoglycemia).  She is benefiting from the slight changes made based on lifestyle  medicine. - she acknowledges that there is a room for improvement in her food and drink choices. - Suggestion is made for her to avoid simple carbohydrates  from her diet including Cakes, Sweet Desserts, Ice Cream, Soda (diet and regular), Sweet Tea, Candies, Chips, Cookies, Store Bought Juices, Alcohol , Artificial Sweeteners,  Coffee Creamer, and "Sugar-free" Products, Lemonade. This will help patient to have more stable blood glucose profile and potentially avoid unintended weight gain.  The following Lifestyle Medicine recommendations according to [facility]) were discussed and and offered to patient and she  agrees to start the journey:  A. Whole Foods, Plant-Based Nutrition comprising of fruits and vegetables, plant-based  proteins, whole-grain carbohydrates was discussed in detail with the patient.   A list for source of those nutrients were also provided to the patient.  Patient will use only water or unsweetened tea for hydration. B.  The need to stay away from risky substances including alcohol, smoking; obtaining 7 to 9 hours of restorative sleep, at least 150 minutes of moderate intensity exercise weekly, the importance of healthy social connections,  and stress management techniques were discussed. C.  A full color page of  Calorie density of various food groups per pound showing examples of each food groups was provided to the patient.   - she will be scheduled with Jearld Fenton, RDN, CDE for diabetes education.  - I have approached her with the following individualized plan to manage  her diabetes and patient agrees:   -She is advised to continue Levemir 50 units nightly, increase NovoLog to 14-20  units 3 times a day with meals  for pre-meal BG readings of 90-'150mg'$ /dl, plus patient specific correction dose for unexpected hyperglycemia above '150mg'$ /dl, associated with strict monitoring of glucose 4 times a day-before meals and at bedtime. -Proper insulin dose and administration is reviewed once again with her. -She is encouraged to continue to use her CGM at all times.    - she is warned not to take insulin without proper monitoring per orders. - Adjustment parameters are given to her for hypo and hyperglycemia in writing. - she is encouraged to call clinic for blood glucose levels less than 70 or above 200 mg /dl. - she is tolerating extended release metformin tolerating extended release metformin, advised to continue metformin 1000 mg ER p.o. daily with breakfast.    -I discussed and increase her Trulicity to 4.5 mg subcutaneously weekly.     - Specific targets for  A1c;  LDL, HDL,  and Triglycerides were discussed with the patient.  2) Blood Pressure /Hypertension:  -Her blood pressure is  controlled to target. She presents with controlled blood pressure.    3) Lipids/Hyperlipidemia:   Review of her recent lipid panel showed uncontrolled LDL at 161 compared to 172 last time.  She did have LDL as high as 211 last year.  She still reports inconsistent utility of pravastatin.  She is urged to be consistent on pravastatin 20 mg p.o. nightly.  She may need a higher dose after she documents consistency with her current dose.  She wishes to avoid injectable treatment for now.     She is also encouraged to engage better with whole food plant-based diet.   4)  Weight/Diet:  Body mass index is 44.1 kg/m.  -   clearly complicating her diabetes care.   she is  a candidate for weight loss. I discussed with her the fact that loss of 5 -  10% of her  current body weight will have the most impact on her diabetes management.  Exercise, and detailed carbohydrates information provided  -  detailed on discharge instructions.  5) vitamin D deficiency:  She is currently on ongoing ergocalciferol 50,000 units weekly for 12 weeks. -After she finished her high-dose supplement, she will continue vitamin D3 2000 units daily for maintenance. Her labs show persistent mild hypercalcemia of 10.9, however improving PTH of 58.  This is still possibly due to early mild primary hyperparathyroidism.    She will continue to need follow-up with PTH/calcium and 24-hour urine calcium study on subsequent visits, after her vitamin D is corrected.  6) abnormal thyroid function tests:  This visit was mainly for follow-up of her recent thyroid uptake and scan ordered due to abnormally high thyroid hormone levels and suppressed TSH.  Her uptake is normal, she will not need ablative treatment for now.  She will be considered for repeat thyroid function test before her next visit.     7) Chronic Care/Health Maintenance:  -she  Is non Statin medications and  is encouraged to initiate and continue to follow up with Ophthalmology,  Dentist,  Podiatrist at least yearly or according to recommendations, and advised to   stay away from smoking. I have recommended yearly flu vaccine and pneumonia vaccine at least every 5 years; moderate intensity exercise for up to 150 minutes weekly; and  sleep for at least 7 hours a day.   Her screening ABI was negative for PAD in March 2022.  Her neck study will be in March 2027 or sooner if needed.    - she is  advised to maintain close follow up with Monico Blitz, MD for primary care needs, as well as her other providers for optimal and coordinated care.   I spent  26  minutes in the care of the patient today including review of labs from [place], Lipids, Thyroid Function, Hematology (current and previous including abstractions from other facilities); face-to-face time discussing  her blood glucose readings/logs, discussing hypoglycemia and hyperglycemia episodes and symptoms, medications doses, her options of short and long term treatment based on the latest standards of care / guidelines;  discussion about incorporating lifestyle medicine;  and documenting the encounter. Risk reduction counseling performed per USPSTF guidelines to reduce  obesity and cardiovascular risk factors.     Please refer to Patient Instructions for Blood Glucose Monitoring and Insulin/Medications Dosing Guide"  in media tab for additional information. Please  also refer to " Patient Self Inventory" in the Media  tab for reviewed elements of pertinent patient history.  Felecia Shelling [place] participated in the discussions, expressed understanding, and voiced agreement with the above plans.  All questions were answered to her satisfaction. she is encouraged to contact clinic should she have any questions or concerns prior to her return visit.    Follow up plan: - Return in about 4 months (around 11/09/2022) for Bring Meter/CGM Device/Logs- A1c in Office.  Glade Lloyd, MD [facility] [address] [place], [place] [address] Phone: [phone]  Fax: [phone]    07/11/2022, 10:54 AM  This note was partially dictated with voice recognition software. Similar sounding words can be transcribed inadequately or may not  be corrected upon review.

## 2022-07-11 NOTE — Patient Instructions (Signed)

## 2022-08-15 ENCOUNTER — Other Ambulatory Visit: Payer: Self-pay | Admitting: Internal Medicine

## 2022-08-15 DIAGNOSIS — Z1231 Encounter for screening mammogram for malignant neoplasm of breast: Secondary | ICD-10-CM

## 2022-09-26 ENCOUNTER — Ambulatory Visit
Admission: RE | Admit: 2022-09-26 | Discharge: 2022-09-26 | Disposition: A | Discharge status: Home / Self Care | Payer: Medicare Other | Source: Ambulatory Visit | Attending: Internal Medicine | Admitting: Internal Medicine

## 2022-09-26 ENCOUNTER — Other Ambulatory Visit: Payer: Self-pay | Admitting: "Endocrinology

## 2022-09-26 DIAGNOSIS — Z1231 Encounter for screening mammogram for malignant neoplasm of breast: Secondary | ICD-10-CM

## 2022-09-26 DIAGNOSIS — E1159 Type 2 diabetes mellitus with other circulatory complications: Secondary | ICD-10-CM

## 2022-10-04 ENCOUNTER — Other Ambulatory Visit: Payer: Self-pay

## 2022-10-04 DIAGNOSIS — E1159 Type 2 diabetes mellitus with other circulatory complications: Secondary | ICD-10-CM

## 2022-10-04 MED ORDER — NOVOLOG FLEXPEN RELION 100 UNIT/ML ~~LOC~~ SOPN
14.0000 [IU] | PEN_INJECTOR | Freq: Three times a day (TID) | SUBCUTANEOUS | 2 refills | Status: DC
Start: 2022-10-04 — End: 2022-12-26

## 2022-11-09 ENCOUNTER — Ambulatory Visit: Payer: Medicare Other | Admitting: "Endocrinology

## 2022-11-28 ENCOUNTER — Ambulatory Visit (INDEPENDENT_AMBULATORY_CARE_PROVIDER_SITE_OTHER): Payer: Medicare Other | Admitting: "Endocrinology

## 2022-11-28 ENCOUNTER — Encounter: Payer: Self-pay | Admitting: "Endocrinology

## 2022-11-28 VITALS — BP 136/80 | HR 84 | Ht 67.0 in | Wt 280.6 lb

## 2022-11-28 DIAGNOSIS — E1159 Type 2 diabetes mellitus with other circulatory complications: Secondary | ICD-10-CM | POA: Diagnosis not present

## 2022-11-28 DIAGNOSIS — I10 Essential (primary) hypertension: Secondary | ICD-10-CM | POA: Diagnosis not present

## 2022-11-28 DIAGNOSIS — E559 Vitamin D deficiency, unspecified: Secondary | ICD-10-CM | POA: Diagnosis not present

## 2022-11-28 DIAGNOSIS — E782 Mixed hyperlipidemia: Secondary | ICD-10-CM

## 2022-11-28 DIAGNOSIS — E0590 Thyrotoxicosis, unspecified without thyrotoxic crisis or storm: Secondary | ICD-10-CM

## 2022-11-28 DIAGNOSIS — Z794 Long term (current) use of insulin: Secondary | ICD-10-CM

## 2022-11-28 LAB — POCT GLYCOSYLATED HEMOGLOBIN (HGB A1C): HbA1c, POC (controlled diabetic range): 8.3 % — AB (ref 0.0–7.0)

## 2022-11-28 MED ORDER — EMPAGLIFLOZIN 10 MG PO TABS
10.0000 mg | ORAL_TABLET | Freq: Every day | ORAL | 1 refills | Status: DC
Start: 2022-11-28 — End: 2023-03-05

## 2022-11-28 NOTE — Patient Instructions (Signed)

## 2022-11-28 NOTE — Progress Notes (Signed)
11/28/2022, 7:59 PM  Endocrinology follow-up note   Subjective:    Patient ID: Laurie Palmer, female    DOB: 07/26/1945.  Sreshta Cressler Chirico is being seen in follow-up with her thyroid uptake and scan.  She was recently seen for the management of her type 2 diabetes.   PMD Kirstie Peri, MD.   Past Medical History:  Diagnosis Date   Colon cancer [facility])    Elevated carcinoembryonic antigen (CEA)    Gout    Hypercalcemia    Mass of left thigh     Past Surgical History:  Procedure Laterality Date   CATARACT EXTRACTION Bilateral 01/24/2021   IRRIGATION AND DEBRIDEMENT ABSCESS Left 03/25/2015   Procedure: IRRIGATION AND DEBRIDEMENT ABSCESS LEFT THIGH WITH BIOPSIES AND WOUND VAC PLACEMENT;  Surgeon: Forde Dandy, MD;  Location: [facility];  Service: General;  Laterality: Left;    Social History   Socioeconomic History   Marital status: Widowed    Spouse name: Not on file   Number of children: Not on file   Years of education: Not on file   Highest education level: Not on file  Occupational History   Not on file  Tobacco Use   Smoking status: Never   Smokeless tobacco: Never  Vaping Use   Vaping status: Never Used  Substance and Sexual Activity   Alcohol use: No    Alcohol/week: 0.0 standard drinks of alcohol   Drug use: No   Sexual activity: Not on file  Other Topics Concern   Not on file  Social History Narrative   Not on file   Social Determinants of Health   Financial Resource Strain: Not on file  Food Insecurity: Not on file  Transportation Needs: Not on file  Physical Activity: Not on file  Stress: Not on file  Social Connections: Not on file    Family History  Problem Relation Age of Onset   Diabetes Mother    Hypertension Mother    Hyperlipidemia Mother    Heart attack Mother    Heart failure Mother    Prostate cancer Father    Prostate cancer Brother     Breast cancer Neg Hx     Outpatient Encounter Medications as of 11/28/2022  Medication Sig   empagliflozin (JARDIANCE) 10 MG TABS tablet Take 1 tablet (10 mg total) by mouth daily before breakfast.   TRESIBA FLEXTOUCH 200 UNIT/ML FlexTouch Pen Inject 50 Units into the skin at bedtime.   aspirin (ASPIRIN EC) 81 MG EC tablet Take 81 mg by mouth daily.    Cholecalciferol 50 MCG (2000 UT) CAPS Take 1 capsule (2,000 Units total) by mouth daily with breakfast.   CINNAMON PO Take 1 tablet by mouth daily.   Continuous Blood Gluc Receiver (FREESTYLE LIBRE 2 READER) DEVI Use to check glucose as directed   Continuous Blood Gluc Sensor (FREESTYLE LIBRE 2 SENSOR) MISC Change sensor every 14 days   Dulaglutide (TRULICITY) 4.5 MG/0.5ML SOPN Inject 4.5 mg as directed once a week.   HYDROcodone-acetaminophen (NORCO/VICODIN) 5-325 MG tablet Take 1 tablet by mouth every 4 (four) hours as needed.  ibuprofen (ADVIL) 800 MG tablet Take 800 mg by mouth every 8 (eight) hours as needed.   insulin aspart (NOVOLOG FLEXPEN) 100 UNIT/ML FlexPen Inject 14-20 Units into the skin 3 (three) times daily with meals.   metFORMIN (GLUCOPHAGE-XR) 500 MG 24 hr tablet Take 2 tablets (1,000 mg total) by mouth daily with breakfast. (Patient taking differently: Take 1-2 tablets by mouth daily with breakfast.)   pravastatin (PRAVACHOL) 20 MG tablet Take 1 tablet (20 mg total) by mouth daily.   [DISCONTINUED] insulin detemir (LEVEMIR) 100 UNIT/ML injection Inject 50 Units into the skin at bedtime.   No facility-administered encounter medications on file as of 11/28/2022.    ALLERGIES: Allergies  Allergen Reactions   Shellfish Allergy Swelling and Anaphylaxis   Lipitor [Atorvastatin] Nausea Only    VACCINATION STATUS: There is no immunization history for the selected administration types on file for this patient.  Diabetes She presents for her follow-up diabetic visit. She has type 2 diabetes mellitus. Onset time: she was  diagnosed ta approx age of 57 yrs. Her disease course has been fluctuating. There are no hypoglycemic associated symptoms. Pertinent negatives for diabetes include no polydipsia and no polyuria. There are no hypoglycemic complications. Symptoms are worsening. Risk factors for coronary artery disease include dyslipidemia, diabetes mellitus, family history, obesity, hypertension, post-menopausal and sedentary lifestyle. Current diabetic treatments: she is on Lantus 40  units qhs, Novlog 12 -18 units TIDAC. Her weight is fluctuating minimally. She is following a generally unhealthy diet. When asked about meal planning, she reported none. Her home blood glucose trend is fluctuating minimally. Her breakfast blood glucose range is generally 140-180 mg/dl. Her lunch blood glucose range is generally 140-180 mg/dl. Her dinner blood glucose range is generally 140-180 mg/dl. Her bedtime blood glucose range is generally 140-180 mg/dl. Her overall blood glucose range is 140-180 mg/dl. (She presents with her CGM showing still significantly abnormal glycemic profile 31% time range, 59% above range, 10% mild hypoglycemia.   Her point-of-care A1c is 8.3%, slightly improving from 8.9%.  )   During her last visit, she was found to have elevated thyroid hormone levels.  She was sent for thyroid uptake and scan, see below.  Review of Systems  Endocrine: Negative for polydipsia and polyuria.    Objective:       11/28/2022    1:09 PM 07/11/2022    9:20 AM 03/08/2022    9:17 AM  Vitals with BMI  Height 5\' 7"  5\' 7"  5\' 7"   Weight 280 lbs 10 oz 281 lbs 10 oz 281 lbs 10 oz  BMI 43.94 44.09 44.09  Systolic 136 130 161  Diastolic 80 78 84  Pulse 84 68 68    BP 136/80   Pulse 84   Ht 5\' 7"  (1.702 m)   Wt 280 lb 9.6 oz (127.3 kg)   BMI 43.95 kg/m   Wt Readings from Last 3 Encounters:  11/28/22 280 lb 9.6 oz (127.3 kg)  07/11/22 281 lb 9.6 oz (127.7 kg)  03/08/22 281 lb 9.6 oz (127.7 kg)     Physical  Exam   CMP ( most recent) CMP     Component Value Date/Time   NA 138 07/04/2022 1005   K 5.5 (H) 07/04/2022 1005   CL 101 07/04/2022 1005   CO2 23 07/04/2022 1005   GLUCOSE 152 (H) 07/04/2022 1005   GLUCOSE 135 (H) 03/26/2015 0540   BUN 11 07/04/2022 1005   CREATININE 0.91 07/04/2022 1005   CALCIUM 10.2 07/04/2022 1005  PROT 7.1 07/04/2022 1005   ALBUMIN 4.0 07/04/2022 1005   AST 21 07/04/2022 1005   ALT 17 07/04/2022 1005   ALKPHOS 110 07/04/2022 1005   BILITOT 0.3 07/04/2022 1005   GFRNONAA 64 01/14/2020 0955   GFRAA 74 01/14/2020 0955     Diabetic Labs (most recent): Lab Results  Component Value Date   HGBA1C 8.3 (A) 11/28/2022   HGBA1C 8.9 (A) 07/11/2022   HGBA1C 8.1 (A) 03/08/2022       Latest Ref Rng & Units 07/04/2022   10:05 AM 10/26/2021   10:17 AM 04/18/2021   10:02 AM  CMP  Glucose 70 - 99 mg/dL 161  096  045   BUN 8 - 27 mg/dL 11  11  14    Creatinine 0.57 - 1.00 mg/dL 4.09  8.11  9.14   Sodium 134 - 144 mmol/L 138  142  140   Potassium 3.5 - 5.2 mmol/L 5.5  4.1  5.3   Chloride 96 - 106 mmol/L 101  103  102   CO2 20 - 29 mmol/L 23  22  23    Calcium 8.7 - 10.3 mg/dL 78.2  95.6  21.3   Total Protein 6.0 - 8.5 g/dL 7.1  6.7  7.1   Total Bilirubin 0.0 - 1.2 mg/dL 0.3  0.3  0.3   Alkaline Phos 44 - 121 IU/L 110  101  115   AST 0 - 40 IU/L 21  23  19    ALT 0 - 32 IU/L 17  17  21     Lipid Panel     Component Value Date/Time   CHOL 256 (H) 07/04/2022 1005   TRIG 285 (H) 07/04/2022 1005   HDL 41 07/04/2022 1005   CHOLHDL 6.2 (H) 07/04/2022 1005   LDLCALC 161 (H) 07/04/2022 1005   LABVLDL 54 (H) 07/04/2022 1005   Thyroid uptake and scan on November 23, 2021.  FINDINGS: Homogeneous uptake in thyroid lobes.   No focal areas of increased or decreased uptake.   4 hour I-131 uptake = 4.9% (normal 5-20%)   24 hour I-131 uptake = 16.5% (normal 10-30%)   IMPRESSION: Normal exam.     Assessment & Plan:   1. DM type 2 causing vascular disease  (HCC)   - Sol Blazing Salonga has currently uncontrolled symptomatic type 2 DM since  77 years of age.  She presents with her CGM showing still significantly abnormal glycemic profile 31% time range, 59% above range, 10% mild hypoglycemia.   Her point-of-care A1c is 8.3%, slightly improving from 8.9%.  - I had a long discussion with her about the progressive nature of diabetes and the pathology behind its complications. -her diabetes is complicated by obesity/sedentary life, obesity and she remains at a high risk for more acute and chronic complications which include CAD, CVA, CKD, retinopathy, and neuropathy. These are all discussed in detail with her.  - I have counseled her on diet  and weight management  by adopting a carbohydrate restricted/protein rich diet. Patient is encouraged to switch to  unprocessed or minimally processed     complex starch and increased protein intake (animal or plant source), fruits, and vegetables. -  she is advised to stick to a routine mealtimes to eat 3 meals  a day and avoid unnecessary snacks ( to snack only to correct hypoglycemia).  She is benefiting from the slight changes made based on lifestyle medicine.  - she acknowledges that there is a room for improvement in her food  and drink choices. - Suggestion is made for her to avoid simple carbohydrates  from her diet including Cakes, Sweet Desserts, Ice Cream, Soda (diet and regular), Sweet Tea, Candies, Chips, Cookies, Store Bought Juices, Alcohol , Artificial Sweeteners,  Coffee Creamer, and "Sugar-free" Products, Lemonade. This will help patient to have more stable blood glucose profile and potentially avoid unintended weight gain.  The following Lifestyle Medicine recommendations according to American College of Lifestyle Medicine  [facility]) were discussed and and offered to patient and she  agrees to start the journey:  A. Whole Foods, Plant-Based Nutrition comprising of fruits and vegetables, plant-based  proteins, whole-grain carbohydrates was discussed in detail with the patient.   A list for source of those nutrients were also provided to the patient.  Patient will use only water or unsweetened tea for hydration. B.  The need to stay away from risky substances including alcohol, smoking; obtaining 7 to 9 hours of restorative sleep, at least 150 minutes of moderate intensity exercise weekly, the importance of healthy social connections,  and stress management techniques were discussed. C.  A full color page of  Calorie density of various food groups per pound showing examples of each food groups was provided to the patient.   - she will be scheduled with Norm Salt, RDN, CDE for diabetes education.  - I have approached her with the following individualized plan to manage  her diabetes and patient agrees:   -She is advised to continue Tresiba 50 units nightly, advised to continue NovoLog 14-20  units 3 times a day with meals  for pre-meal BG readings of 90-150mg /dl, plus patient specific correction dose for unexpected hyperglycemia above 150mg /dl, associated with strict monitoring of glucose 4 times a day-before meals and at bedtime.  -She is encouraged to continue to use her CGM at all times.    - she is warned not to take insulin without proper monitoring per orders. - Adjustment parameters are given to her for hypo and hyperglycemia in writing. - she is encouraged to call clinic for blood glucose levels less than 70 or above 200 mg /dl. - she is tolerating extended release metformin tolerating extended release metformin, advised to continue metformin 1000 mg ER p.o. daily with breakfast.    -I discussed and increase her Trulicity to 4.5 mg subcutaneously weekly.   -She may benefit from intervention with SGLT2 inhibitors.  I discussed and added Jardiance 10 mg p.o. daily at breakfast.  Side effects and precautions discussed with her.   - Specific targets for  A1c;  LDL, HDL,  and  Triglycerides were discussed with the patient.  2) Blood Pressure /Hypertension:  Her blood pressure is controlled to target. She presents with controlled blood pressure.    3) Lipids/Hyperlipidemia:   Review of her recent lipid panel prior to her last visit showed LDL of 161, 172 on her prior measurement.  She insists that she is now taking her pravastatin 20 mg p.o. nightly.  She is advised to continue.  She wishes to avoid injectable treatment for now.     She is also encouraged to engage better with whole food plant-based diet.   4)  Weight/Diet:  Body mass index is 43.95 kg/m.  -   clearly complicating her diabetes care.   she is  a candidate for weight loss. I discussed with her the fact that loss of 5 - 10% of her  current body weight will have the most impact on her diabetes management.  Exercise,  and detailed carbohydrates information provided  -  detailed on discharge instructions.  5) vitamin D deficiency:  She is currently on ongoing ergocalciferol 50,000 units weekly for 12 weeks. -After she finished her high-dose supplement, she will continue vitamin D3 2000 units daily for maintenance. Her labs show persistent mild hypercalcemia of 10.9, however improving PTH of 58.  This is still possibly due to early mild primary hyperparathyroidism.    She will continue to need follow-up with PTH/calcium and 24-hour urine calcium study on subsequent visits, after her vitamin D is corrected.  6) abnormal thyroid function tests: Resolved Her most recent thyroid function tests were within normal limits.  7) Chronic Care/Health Maintenance:  -she  Is non Statin medications and  is encouraged to initiate and continue to follow up with Ophthalmology, Dentist,  Podiatrist at least yearly or according to recommendations, and advised to   stay away from smoking. I have recommended yearly flu vaccine and pneumonia vaccine at least every 5 years; moderate intensity exercise for up to 150 minutes weekly;  and  sleep for at least 7 hours a day.   Her screening ABI was negative for PAD in March 2022.  Her neck study will be in March 2027 or sooner if needed.    - she is  advised to maintain close follow up with Kirstie Peri, MD for primary care needs, as well as her other providers for optimal and coordinated care.    I spent  43  minutes in the care of the patient today including review of labs from CMP, Lipids, Thyroid Function, Hematology (current and previous including abstractions from other facilities); face-to-face time discussing  her blood glucose readings/logs, discussing hypoglycemia and hyperglycemia episodes and symptoms, medications doses, her options of short and long term treatment based on the latest standards of care / guidelines;  discussion about incorporating lifestyle medicine;  and documenting the encounter. Risk reduction counseling performed per USPSTF guidelines to reduce  obesity and cardiovascular risk factors.     Please refer to Patient Instructions for Blood Glucose Monitoring and Insulin/Medications Dosing Guide"  in media tab for additional information. Please  also refer to " Patient Self Inventory" in the Media  tab for reviewed elements of pertinent patient history.  Sol Blazing Frasier participated in the discussions, expressed understanding, and voiced agreement with the above plans.  All questions were answered to her satisfaction. she is encouraged to contact clinic should she have any questions or concerns prior to her return visit.    Follow up plan: - Return in about 3 months (around 02/28/2023) for F/U with Pre-visit Labs, Meter/CGM/Logs, A1c here.  Marquis Lunch, MD [facility] [address] [place], [place] [address] Phone: [phone]  Fax: [phone]    11/28/2022, 7:59 PM  This note was partially dictated with voice recognition software. Similar sounding words can be transcribed  inadequately or may not  be corrected upon review.

## 2022-12-15 ENCOUNTER — Telehealth: Payer: Self-pay

## 2022-12-15 NOTE — Telephone Encounter (Signed)
Tried to return a call to pt but had to leave a message requesting she return call to the office.

## 2022-12-26 ENCOUNTER — Telehealth: Payer: Self-pay | Admitting: "Endocrinology

## 2022-12-26 ENCOUNTER — Other Ambulatory Visit: Payer: Self-pay | Admitting: "Endocrinology

## 2022-12-26 MED ORDER — FIASP FLEXTOUCH 100 UNIT/ML ~~LOC~~ SOPN: 14.0000 [IU] | PEN_INJECTOR | Freq: Three times a day (TID) | SUBCUTANEOUS | 1 refills | Status: DC

## 2022-12-26 NOTE — Telephone Encounter (Signed)
Pt states that insurance will not cover Novolog, but wanted to see if the prescription could be called in for Fiasp to [employer] [facility]. Pt states she is almost out of insulin.

## 2023-02-27 LAB — COMPREHENSIVE METABOLIC PANEL
ALT: 16 [IU]/L (ref 0–32)
AST: 21 [IU]/L (ref 0–40)
Albumin: 4.1 g/dL (ref 3.8–4.8)
Alkaline Phosphatase: 121 [IU]/L (ref 44–121)
BUN/Creatinine Ratio: 16 (ref 12–28)
BUN: 17 mg/dL (ref 8–27)
Bilirubin Total: 0.5 mg/dL (ref 0.0–1.2)
CO2: 22 mmol/L (ref 20–29)
Calcium: 10 mg/dL (ref 8.7–10.3)
Chloride: 105 mmol/L (ref 96–106)
Creatinine, Ser: 1.06 mg/dL — ABNORMAL HIGH (ref 0.57–1.00)
Globulin, Total: 2.8 g/dL (ref 1.5–4.5)
Glucose: 124 mg/dL — ABNORMAL HIGH (ref 70–99)
Potassium: 5.3 mmol/L — ABNORMAL HIGH (ref 3.5–5.2)
Sodium: 143 mmol/L (ref 134–144)
Total Protein: 6.9 g/dL (ref 6.0–8.5)
eGFR: 54 mL/min/{1.73_m2} — ABNORMAL LOW (ref >59)

## 2023-02-27 LAB — LIPID PANEL
Chol/HDL Ratio: 5.4 {ratio} — ABNORMAL HIGH (ref 0.0–4.4)
Cholesterol, Total: 215 mg/dL — ABNORMAL HIGH (ref 100–199)
HDL: 40 mg/dL (ref >39)
LDL Chol Calc (NIH): 145 mg/dL — ABNORMAL HIGH (ref 0–99)
Triglycerides: 163 mg/dL — ABNORMAL HIGH (ref 0–149)
VLDL Cholesterol Cal: 30 mg/dL (ref 5–40)

## 2023-03-05 ENCOUNTER — Ambulatory Visit (INDEPENDENT_AMBULATORY_CARE_PROVIDER_SITE_OTHER): Payer: Medicare Other | Admitting: "Endocrinology

## 2023-03-05 ENCOUNTER — Encounter: Payer: Self-pay | Admitting: "Endocrinology

## 2023-03-05 VITALS — BP 130/76 | HR 84 | Ht 67.0 in | Wt 279.8 lb

## 2023-03-05 DIAGNOSIS — I10 Essential (primary) hypertension: Secondary | ICD-10-CM | POA: Diagnosis not present

## 2023-03-05 DIAGNOSIS — E559 Vitamin D deficiency, unspecified: Secondary | ICD-10-CM | POA: Diagnosis not present

## 2023-03-05 DIAGNOSIS — E1159 Type 2 diabetes mellitus with other circulatory complications: Secondary | ICD-10-CM | POA: Diagnosis not present

## 2023-03-05 DIAGNOSIS — E782 Mixed hyperlipidemia: Secondary | ICD-10-CM

## 2023-03-05 DIAGNOSIS — Z794 Long term (current) use of insulin: Secondary | ICD-10-CM

## 2023-03-05 LAB — POCT GLYCOSYLATED HEMOGLOBIN (HGB A1C): HbA1c, POC (controlled diabetic range): 7.6 % — AB (ref 0.0–7.0)

## 2023-03-05 MED ORDER — TIRZEPATIDE 2.5 MG/0.5ML ~~LOC~~ SOAJ: 2.5000 mg | SUBCUTANEOUS | 1 refills | Status: DC

## 2023-03-05 MED ORDER — FIASP FLEXTOUCH 100 UNIT/ML ~~LOC~~ SOPN: 10.0000 [IU] | PEN_INJECTOR | Freq: Three times a day (TID) | SUBCUTANEOUS | 1 refills | Status: DC

## 2023-03-05 MED ORDER — EMPAGLIFLOZIN 25 MG PO TABS: 25.0000 mg | ORAL_TABLET | Freq: Every day | ORAL | 1 refills | Status: DC

## 2023-03-05 MED ORDER — METFORMIN HCL ER 500 MG PO TB24: 500.0000 mg | ORAL_TABLET | Freq: Every day | ORAL | 1 refills | Status: AC

## 2023-03-05 NOTE — Patient Instructions (Signed)
                                     Advice for Weight Management  -For most of us the best way to lose weight is by diet management. Generally speaking, diet management means consuming less calories intentionally which over time brings about progressive weight loss.  This can be achieved more effectively by avoiding ultra processed carbohydrates, processed meats, unhealthy fats.    It is critically important to know your numbers: how much calorie you are consuming and how much calorie you need. More importantly, our carbohydrates sources should be unprocessed naturally occurring  complex starch food items.  It is always important to balance nutrition also by  appropriate intake of proteins (mainly plant-based), healthy fats/oils, plenty of fruits and vegetables.   -The American College of Lifestyle Medicine (ACL M) recommends nutrition derived mostly from Whole Food, Plant Predominant Sources example an apple instead of applesauce or apple pie. Eat Plenty of vegetables, Mushrooms, fruits, Legumes, Whole Grains, Nuts, seeds in lieu of processed meats, processed snacks/pastries red meat, poultry, eggs.  Use only water or unsweetened tea for hydration.  The College also recommends the need to stay away from risky substances including alcohol, smoking; obtaining 7-9 hours of restorative sleep, at least 150 minutes of moderate intensity exercise weekly, importance of healthy social connections, and being mindful of stress and seek help when it is overwhelming.    -Sticking to a routine mealtime to eat 3 meals a day and avoiding unnecessary snacks is shown to have a big role in weight control. Under normal circumstances, the only time we burn stored energy is when we are hungry, so allow  some hunger to take place- hunger means no food between appropriate meal times, only water.  It is not advisable to starve.   -It is better to avoid simple carbohydrates including:  Cakes, Sweet Desserts, Ice Cream, Soda (diet and regular), Sweet Tea, Candies, Chips, Cookies, Store Bought Juices, Alcohol in Excess of  1-2 drinks a day, Lemonade,  Artificial Sweeteners, Doughnuts, Coffee Creamers, "Sugar-free" Products, etc, etc.  This is not a complete list.....    -Consulting with certified diabetes educators is proven to provide you with the most accurate and current information on diet.  Also, you may be  interested in discussing diet options/exchanges , we can schedule a visit with Laurie Palmer, RDN, CDE for individualized nutrition education.  -Exercise: If you are able: 30 -60 minutes a day ,4 days a week, or 150 minutes of moderate intensity exercise weekly.    The longer the better if tolerated.  Combine stretch, strength, and aerobic activities.  If you were told in the past that you have high risk for cardiovascular diseases, or if you are currently symptomatic, you may seek evaluation by your heart doctor prior to initiating moderate to intense exercise programs.                                  Additional Care Considerations for Diabetes/Prediabetes   -Diabetes  is a chronic disease.  The most important care consideration is regular follow-up with your diabetes care provider with the goal being avoiding or delaying its complications and to take advantage of advances in medications and technology.  If appropriate actions are taken early enough, type 2 diabetes can even be   reversed.  Seek information from the right source.  - Whole Food, Plant Predominant Nutrition is highly recommended: Eat Plenty of vegetables, Mushrooms, fruits, Legumes, Whole Grains, Nuts, seeds in lieu of processed meats, processed snacks/pastries red meat, poultry, eggs as recommended by American College of  Lifestyle Medicine (ACLM).  -Type 2 diabetes is known to coexist with other important comorbidities such as high blood pressure and high cholesterol.  It is critical to control not only the  diabetes but also the high blood pressure and high cholesterol to minimize and delay the risk of complications including coronary artery disease, stroke, amputations, blindness, etc.  The good news is that this diet recommendation for type 2 diabetes is also very helpful for managing high cholesterol and high blood blood pressure.  - Studies showed that people with diabetes will benefit from a class of medications known as ACE inhibitors and statins.  Unless there are specific reasons not to be on these medications, the standard of care is to consider getting one from these groups of medications at an optimal doses.  These medications are generally considered safe and proven to help protect the heart and the kidneys.    - People with diabetes are encouraged to initiate and maintain regular follow-up with eye doctors, foot doctors, dentists , and if necessary heart and kidney doctors.     - It is highly recommended that people with diabetes quit smoking or stay away from smoking, and get yearly  flu vaccine and pneumonia vaccine at least every 5 years.  See above for additional recommendations on exercise, sleep, stress management , and healthy social connections.      

## 2023-03-05 NOTE — Progress Notes (Signed)
03/05/2023, 11:32 AM  Endocrinology follow-up note   Subjective:    Patient ID: Laurie Palmer, female    DOB: 1946-03-19.  Roniah Paguio Laplante is being seen in follow-up with her thyroid uptake and scan.  She was recently seen for the management of her type 2 diabetes.   PMD Kirstie Peri, MD.   Past Medical History:  Diagnosis Date   Colon cancer [facility])    Elevated carcinoembryonic antigen (CEA)    Gout    Hypercalcemia    Mass of left thigh     Past Surgical History:  Procedure Laterality Date   CATARACT EXTRACTION Bilateral 01/24/2021   IRRIGATION AND DEBRIDEMENT ABSCESS Left 03/25/2015   Procedure: IRRIGATION AND DEBRIDEMENT ABSCESS LEFT THIGH WITH BIOPSIES AND WOUND VAC PLACEMENT;  Surgeon: Forde Dandy, MD;  Location: [facility];  Service: General;  Laterality: Left;    Social History   Socioeconomic History   Marital status: Widowed    Spouse name: Not on file   Number of children: Not on file   Years of education: Not on file   Highest education level: Not on file  Occupational History   Not on file  Tobacco Use   Smoking status: Never   Smokeless tobacco: Never  Vaping Use   Vaping status: Never Used  Substance and Sexual Activity   Alcohol use: No    Alcohol/week: 0.0 standard drinks of alcohol   Drug use: No   Sexual activity: Not on file  Other Topics Concern   Not on file  Social History Narrative   Not on file   Social Determinants of Health   Financial Resource Strain: Not on file  Food Insecurity: Not on file  Transportation Needs: Not on file  Physical Activity: Not on file  Stress: Not on file  Social Connections: Not on file    Family History  Problem Relation Age of Onset   Diabetes Mother    Hypertension Mother    Hyperlipidemia Mother    Heart attack Mother    Heart failure Mother    Prostate cancer Father    Prostate cancer Brother     Breast cancer Neg Hx     Outpatient Encounter Medications as of 03/05/2023  Medication Sig   tirzepatide (MOUNJARO) 2.5 MG/0.5ML Pen Inject 2.5 mg into the skin once a week.   aspirin (ASPIRIN EC) 81 MG EC tablet Take 81 mg by mouth daily.    Cholecalciferol 50 MCG (2000 UT) CAPS Take 1 capsule (2,000 Units total) by mouth daily with breakfast.   CINNAMON PO Take 1 tablet by mouth daily.   Continuous Blood Gluc Receiver (FREESTYLE LIBRE 2 READER) DEVI Use to check glucose as directed   Continuous Blood Gluc Sensor (FREESTYLE LIBRE 2 SENSOR) MISC Change sensor every 14 days   empagliflozin (JARDIANCE) 25 MG TABS tablet Take 1 tablet (25 mg total) by mouth daily before breakfast.   HYDROcodone-acetaminophen (NORCO/VICODIN) 5-325 MG tablet Take 1 tablet by mouth every 4 (four) hours as needed.   ibuprofen (ADVIL) 800 MG tablet Take 800 mg by mouth every 8 (eight) hours  as needed.   insulin aspart (FIASP FLEXTOUCH) 100 UNIT/ML FlexTouch Pen Inject 10-16 Units into the skin 3 (three) times daily before meals.   metFORMIN (GLUCOPHAGE-XR) 500 MG 24 hr tablet Take 1 tablet (500 mg total) by mouth daily with breakfast.   pravastatin (PRAVACHOL) 20 MG tablet Take 1 tablet (20 mg total) by mouth daily.   TRESIBA FLEXTOUCH 200 UNIT/ML FlexTouch Pen Inject 40 Units into the skin at bedtime.   [DISCONTINUED] Dulaglutide (TRULICITY) 4.5 MG/0.5ML SOPN Inject 4.5 mg as directed once a week.   [DISCONTINUED] empagliflozin (JARDIANCE) 10 MG TABS tablet Take 1 tablet (10 mg total) by mouth daily before breakfast.   [DISCONTINUED] insulin aspart (FIASP FLEXTOUCH) 100 UNIT/ML FlexTouch Pen Inject 14-20 Units into the skin 3 (three) times daily before meals.   [DISCONTINUED] metFORMIN (GLUCOPHAGE-XR) 500 MG 24 hr tablet Take 2 tablets (1,000 mg total) by mouth daily with breakfast. (Patient taking differently: Take 1 tablet by mouth daily with breakfast.)   No facility-administered encounter medications on file as  of 03/05/2023.    ALLERGIES: Allergies  Allergen Reactions   Shellfish Allergy Swelling and Anaphylaxis   Lipitor [Atorvastatin] Nausea Only    VACCINATION STATUS: There is no immunization history for the selected administration types on file for this patient.  Diabetes She presents for her follow-up diabetic visit. She has type 2 diabetes mellitus. Onset time: she was diagnosed ta approx age of 77 yrs. Her disease course has been improving. There are no hypoglycemic associated symptoms. Pertinent negatives for diabetes include no polydipsia and no polyuria. There are no hypoglycemic complications. Symptoms are improving. Risk factors for coronary artery disease include dyslipidemia, diabetes mellitus, family history, obesity, hypertension, post-menopausal and sedentary lifestyle. Current diabetic treatments: she is on Lantus 40  units qhs, Novlog 12 -18 units TIDAC. Her weight is stable. She is following a generally unhealthy diet. When asked about meal planning, she reported none. Her home blood glucose trend is decreasing steadily. Her breakfast blood glucose range is generally 140-180 mg/dl. Her lunch blood glucose range is generally 140-180 mg/dl. Her dinner blood glucose range is generally 140-180 mg/dl. Her bedtime blood glucose range is generally 140-180 mg/dl. Her overall blood glucose range is 140-180 mg/dl. (She presents with her CGM showing significant improvement from her previous presentations.  Her AGP report shows 82% time range, 17% level 1 hyperglycemia, 1% level 2 hyperglycemia.  She has no hypoglycemia.  Her point-of-care A1c 7.6%, progressively improving from 8.9%.  )   During her last visit, she was found to have elevated thyroid hormone levels.  She was sent for thyroid uptake and scan, see below.  Review of Systems  Endocrine: Negative for polydipsia and polyuria.    Objective:       03/05/2023   10:50 AM 11/28/2022    1:09 PM 07/11/2022    9:20 AM  Vitals with BMI   Height 5\' 7"  5\' 7"  5\' 7"   Weight 279 lbs 13 oz 280 lbs 10 oz 281 lbs 10 oz  BMI 43.81 43.94 44.09  Systolic 130 136 130  Diastolic 76 80 78  Pulse 84 84 68    BP 130/76   Pulse 84   Ht 5\' 7"  (1.702 m)   Wt 279 lb 12.8 oz (126.9 kg)   BMI 43.82 kg/m   Wt Readings from Last 3 Encounters:  03/05/23 279 lb 12.8 oz (126.9 kg)  11/28/22 280 lb 9.6 oz (127.3 kg)  07/11/22 281 lb 9.6 oz (127.7 kg)  Physical Exam   CMP ( most recent) CMP     Component Value Date/Time   NA 143 02/26/2023 1010   K 5.3 (H) 02/26/2023 1010   CL 105 02/26/2023 1010   CO2 22 02/26/2023 1010   GLUCOSE 124 (H) 02/26/2023 1010   GLUCOSE 135 (H) 03/26/2015 0540   BUN 17 02/26/2023 1010   CREATININE 1.06 (H) 02/26/2023 1010   CALCIUM 10.0 02/26/2023 1010   PROT 6.9 02/26/2023 1010   ALBUMIN 4.1 02/26/2023 1010   AST 21 02/26/2023 1010   ALT 16 02/26/2023 1010   ALKPHOS 121 02/26/2023 1010   BILITOT 0.5 02/26/2023 1010   GFRNONAA 64 01/14/2020 0955   GFRAA 74 01/14/2020 0955     Diabetic Labs (most recent): Lab Results  Component Value Date   HGBA1C 7.6 (A) 03/05/2023   HGBA1C 8.3 (A) 11/28/2022   HGBA1C 8.9 (A) 07/11/2022       Latest Ref Rng & Units 02/26/2023   10:10 AM 07/04/2022   10:05 AM 10/26/2021   10:17 AM  CMP  Glucose 70 - 99 mg/dL 409  811  914   BUN 8 - 27 mg/dL 17  11  11    Creatinine 0.57 - 1.00 mg/dL 7.82  9.56  2.13   Sodium 134 - 144 mmol/L 143  138  142   Potassium 3.5 - 5.2 mmol/L 5.3  5.5  4.1   Chloride 96 - 106 mmol/L 105  101  103   CO2 20 - 29 mmol/L 22  23  22    Calcium 8.7 - 10.3 mg/dL 08.6  57.8  46.9   Total Protein 6.0 - 8.5 g/dL 6.9  7.1  6.7   Total Bilirubin 0.0 - 1.2 mg/dL 0.5  0.3  0.3   Alkaline Phos 44 - 121 IU/L 121  110  101   AST 0 - 40 IU/L 21  21  23    ALT 0 - 32 IU/L 16  17  17     Lipid Panel     Component Value Date/Time   CHOL 215 (H) 02/26/2023 1010   TRIG 163 (H) 02/26/2023 1010   HDL 40 02/26/2023 1010   CHOLHDL 5.4 (H)  02/26/2023 1010   LDLCALC 145 (H) 02/26/2023 1010   LABVLDL 30 02/26/2023 1010   Thyroid uptake and scan on November 23, 2021.  FINDINGS: Homogeneous uptake in thyroid lobes.   No focal areas of increased or decreased uptake.   4 hour I-131 uptake = 4.9% (normal 5-20%)   24 hour I-131 uptake = 16.5% (normal 10-30%)   IMPRESSION: Normal exam.     Assessment & Plan:   1. DM type 2 causing vascular disease (HCC)   - Sol Blazing Genter has currently uncontrolled symptomatic type 2 DM since  77 years of age.  She presents with her CGM showing significant improvement from her previous presentations.  Her AGP report shows 82% time range, 17% level 1 hyperglycemia, 1% level 2 hyperglycemia.  She has no hypoglycemia.  Her point-of-care A1c 7.6%, progressively improving from 8.9%.    - I had a long discussion with her about the progressive nature of diabetes and the pathology behind its complications. -her diabetes is complicated by obesity/sedentary life, obesity and she remains at a high risk for more acute and chronic complications which include CAD, CVA, CKD, retinopathy, and neuropathy. These are all discussed in detail with her.  - I have counseled her on diet  and weight management  by adopting a  carbohydrate restricted/protein rich diet. Patient is encouraged to switch to  unprocessed or minimally processed     complex starch and increased protein intake (animal or plant source), fruits, and vegetables. -  she is advised to stick to a routine mealtimes to eat 3 meals  a day and avoid unnecessary snacks ( to snack only to correct hypoglycemia).  She is benefiting from the slight changes made based on lifestyle medicine.  - she acknowledges that there is a room for improvement in her food and drink choices. - Suggestion is made for her to avoid simple carbohydrates  from her diet including Cakes, Sweet Desserts, Ice Cream, Soda (diet and regular), Sweet Tea, Candies, Chips, Cookies,  Store Bought Juices, Alcohol , Artificial Sweeteners,  Coffee Creamer, and "Sugar-free" Products, Lemonade. This will help patient to have more stable blood glucose profile and potentially avoid unintended weight gain.  The following Lifestyle Medicine recommendations according to American College of Lifestyle Medicine  [facility]) were discussed and and offered to patient and she  agrees to start the journey:  A. Whole Foods, Plant-Based Nutrition comprising of fruits and vegetables, plant-based proteins, whole-grain carbohydrates was discussed in detail with the patient.   A list for source of those nutrients were also provided to the patient.  Patient will use only water or unsweetened tea for hydration. B.  The need to stay away from risky substances including alcohol, smoking; obtaining 7 to 9 hours of restorative sleep, at least 150 minutes of moderate intensity exercise weekly, the importance of healthy social connections,  and stress management techniques were discussed. C.  A full color page of  Calorie density of various food groups per pound showing examples of each food groups was provided to the patient.   - she will be scheduled with Norm Salt, RDN, CDE for diabetes education.  - I have approached her with the following individualized plan to manage  her diabetes and patient agrees:   -In light of her presentation with near target glycemic profile, she will be considered for de-escalation of insulin treatment.  Accordingly, she is advised to lower her Evaristo Bury to 40 units nightly, advised to lower her NovoLog to 10-16  units 3 times a day with meals  for pre-meal BG readings of 90-150mg /dl, plus patient specific correction dose for unexpected hyperglycemia above 150mg /dl, associated with strict monitoring of glucose 4 times a day-before meals and at bedtime.  -She is encouraged to continue to use her CGM at all times.    - she is warned not to take insulin without proper monitoring per  orders. - Adjustment parameters are given to her for hypo and hyperglycemia in writing. - she is encouraged to call clinic for blood glucose levels less than 70 or above 200 mg /dl. - she is tolerating extended release metformin tolerating extended release metformin, she would like to lower the dose to 500 mg ER p.o. daily at breakfast.   -She is benefiting from [place].  I discussed and increase her Jardiance to 25 mg p.o. daily at breakfast.  Side effects and precautions discussed with her. -From their point of view of how weight management, she would benefit from [facility] more than Trulicity.  After she finishes her current supplies of Trulicity, she will be considered for Mounjaro 2.5 mg subcutaneously weekly.  Side effects and precautions are discussed with her.  - Specific targets for  A1c;  LDL, HDL,  and Triglycerides were discussed with the patient.  2) Blood Pressure /Hypertension:  Her blood pressure is controlled to target. She presents with controlled blood pressure.    3) Lipids/Hyperlipidemia:   Review of her recent lipid showed only slight improvement in her LDL to 145. She has gotten better taking her pravastatin, advised to continue pravastatin 20 mg p.o. nightly.   She is also encouraged to engage better with whole food plant-based diet.   4)  Weight/Diet:  Body mass index is 43.82 kg/m.  -   clearly complicating her diabetes care.   she is  a candidate for weight loss. I discussed with her the fact that loss of 5 - 10% of her  current body weight will have the most impact on her diabetes management.  Exercise, and detailed carbohydrates information provided  -  detailed on discharge instructions.  5) vitamin D deficiency:  She is currently on ongoing ergocalciferol 50,000 units weekly for 12 weeks. -After she finished her high-dose supplement, she will continue vitamin D3 2000 units daily for maintenance. Her labs show persistent mild hypercalcemia of 10.9, however  improving PTH of 58.  This is still possibly due to early mild primary hyperparathyroidism.    She will continue to need follow-up with PTH/calcium and 24-hour urine calcium study on subsequent visits, after her vitamin D is corrected.  6) abnormal thyroid function tests: Resolved Her most recent thyroid function tests were within normal limits.  7) Chronic Care/Health Maintenance:  -she  Is non Statin medications and  is encouraged to initiate and continue to follow up with Ophthalmology, Dentist,  Podiatrist at least yearly or according to recommendations, and advised to   stay away from smoking. I have recommended yearly flu vaccine and pneumonia vaccine at least every 5 years; moderate intensity exercise for up to 150 minutes weekly; and  sleep for at least 7 hours a day.   Her screening ABI was negative for PAD in March 2022.  Her neck study will be in March 2027 or sooner if needed.    - she is  advised to maintain close follow up with Kirstie Peri, MD for primary care needs, as well as her other providers for optimal and coordinated care.   I spent  45  minutes in the care of the patient today including review of labs from CMP, Lipids, Thyroid Function, Hematology (current and previous including abstractions from other facilities); face-to-face time discussing  her blood glucose readings/logs, discussing hypoglycemia and hyperglycemia episodes and symptoms, medications doses, her options of short and long term treatment based on the latest standards of care / guidelines;  discussion about incorporating lifestyle medicine;  and documenting the encounter. Risk reduction counseling performed per USPSTF guidelines to reduce  obesity and cardiovascular risk factors.     Please refer to Patient Instructions for Blood Glucose Monitoring and Insulin/Medications Dosing Guide"  in media tab for additional information. Please  also refer to " Patient Self Inventory" in the Media  tab for reviewed  elements of pertinent patient history.  Sol Blazing Broccoli participated in the discussions, expressed understanding, and voiced agreement with the above plans.  All questions were answered to her satisfaction. she is encouraged to contact clinic should she have any questions or concerns prior to her return visit.    Follow up plan: - Return in about 4 months (around 07/06/2023) for Bring Meter/CGM Device/Logs- A1c in Office.  Marquis Lunch, MD [facility] [address] [place], [place] [address] Phone: [phone]  Fax: [phone]    03/05/2023,  11:32 AM  This note was partially dictated with voice recognition software. Similar sounding words can be transcribed inadequately or may not  be corrected upon review.

## 2023-03-09 ENCOUNTER — Telehealth: Payer: Self-pay

## 2023-03-09 NOTE — Telephone Encounter (Signed)
Left a message requesting pt return call to the office. 

## 2023-05-16 HISTORY — PX: ABDOMINAL HYSTERECTOMY: SHX81

## 2023-05-29 ENCOUNTER — Other Ambulatory Visit: Payer: Self-pay | Admitting: "Endocrinology

## 2023-05-29 ENCOUNTER — Telehealth: Payer: Self-pay | Admitting: "Endocrinology

## 2023-05-29 MED ORDER — INSULIN ASPART FLEXPEN 100 UNIT/ML ~~LOC~~ SOPN: PEN_INJECTOR | SUBCUTANEOUS | 0 refills | Status: DC

## 2023-05-29 MED ORDER — TRULICITY 1.5 MG/0.5ML ~~LOC~~ SOAJ: 1.5000 mg | SUBCUTANEOUS | 1 refills | Status: DC

## 2023-05-29 NOTE — Telephone Encounter (Signed)
 Left a message requesting pt return call to the office.

## 2023-05-29 NOTE — Telephone Encounter (Signed)
 Pt is stating that Moujaro nor [place] is covered under pts insurance.  Is there anything different that can be called in?

## 2023-05-29 NOTE — Telephone Encounter (Signed)
 Sent Novolog to pts pharmacy

## 2023-05-29 NOTE — Telephone Encounter (Signed)
 Pt states the Fiasp  is not covered by insurance, but Insurance is requesting Insulin  Aspart flexpen, pt is completely out of insulin .  Pharmacy never told her that insurance wouldn't cover it, also didn't provide a 30 day supply as stated by insurance.  Pt picks up medicine at [facility] in [place].

## 2023-05-30 NOTE — Telephone Encounter (Signed)
 Left a message requesting pt return call to the office.

## 2023-05-31 NOTE — Telephone Encounter (Signed)
Pt made aware - she said that the novolog is over a hundred dollars. Can you send in the Aspart, it is $35.00

## 2023-05-31 NOTE — Telephone Encounter (Signed)
Tried to call pt, did not receive an answer and was unable to leave a message. 

## 2023-05-31 NOTE — Telephone Encounter (Signed)
Spoke with pharmacy tech with [facility], she stated they have the Rx for Aspart which $35.00 for a months supply but pt has a 90 day supply and that is the reason for the $!05.00 copay. Tried to call pt but had to leave a message making her aware.

## 2023-07-09 ENCOUNTER — Encounter: Payer: Self-pay | Admitting: "Endocrinology

## 2023-07-09 ENCOUNTER — Ambulatory Visit (INDEPENDENT_AMBULATORY_CARE_PROVIDER_SITE_OTHER): Payer: Medicare Other | Admitting: "Endocrinology

## 2023-07-09 VITALS — BP 118/86 | HR 68 | Ht 67.0 in | Wt 278.4 lb

## 2023-07-09 DIAGNOSIS — I10 Essential (primary) hypertension: Secondary | ICD-10-CM | POA: Diagnosis not present

## 2023-07-09 DIAGNOSIS — E1159 Type 2 diabetes mellitus with other circulatory complications: Secondary | ICD-10-CM

## 2023-07-09 DIAGNOSIS — E559 Vitamin D deficiency, unspecified: Secondary | ICD-10-CM | POA: Diagnosis not present

## 2023-07-09 DIAGNOSIS — E782 Mixed hyperlipidemia: Secondary | ICD-10-CM

## 2023-07-09 DIAGNOSIS — Z794 Long term (current) use of insulin: Secondary | ICD-10-CM

## 2023-07-09 MED ORDER — TRULICITY 1.5 MG/0.5ML ~~LOC~~ SOAJ: 1.5000 mg | SUBCUTANEOUS | 1 refills | Status: DC

## 2023-07-09 NOTE — Progress Notes (Signed)
 07/09/2023, 12:40 PM  Endocrinology follow-up note   Subjective:    Patient ID: Laurie Palmer, female    DOB: [birth year].  Laurie Palmer is being seen in follow-up with her thyroid uptake and scan.  She was recently seen for the management of her type 2 diabetes.   PMD Kirstie Peri, MD.   Past Medical History:  Diagnosis Date   Colon cancer [facility])    Elevated carcinoembryonic antigen (CEA)    Gout    Hypercalcemia    Mass of left thigh     Past Surgical History:  Procedure Laterality Date   CATARACT EXTRACTION Bilateral 01/24/2021   IRRIGATION AND DEBRIDEMENT ABSCESS Left 03/25/2015   Procedure: IRRIGATION AND DEBRIDEMENT ABSCESS LEFT THIGH WITH BIOPSIES AND WOUND VAC PLACEMENT;  Surgeon: Forde Dandy, MD;  Location: [facility];  Service: General;  Laterality: Left;    Social History   Socioeconomic History   Marital status: Widowed    Spouse name: Not on file   Number of children: Not on file   Years of education: Not on file   Highest education level: Not on file  Occupational History   Not on file  Tobacco Use   Smoking status: Never   Smokeless tobacco: Never  Vaping Use   Vaping status: Never Used  Substance and Sexual Activity   Alcohol use: No    Alcohol/week: 0.0 standard drinks of alcohol   Drug use: No   Sexual activity: Not on file  Other Topics Concern   Not on file  Social History Narrative   Not on file   Social Drivers of Health   Financial Resource Strain: Not on file  Food Insecurity: Not on file  Transportation Needs: Not on file  Physical Activity: Not on file  Stress: Not on file  Social Connections: Not on file    Family History  Problem Relation Age of Onset   Diabetes Mother    Hypertension Mother    Hyperlipidemia Mother    Heart attack Mother    Heart failure Mother    Prostate cancer Father    Prostate cancer Brother    Breast  cancer Neg Hx     Outpatient Encounter Medications as of 07/09/2023  Medication Sig   aspirin (ASPIRIN EC) 81 MG EC tablet Take 81 mg by mouth daily.    Cholecalciferol 50 MCG (2000 UT) CAPS Take 1 capsule (2,000 Units total) by mouth daily with breakfast.   CINNAMON PO Take 1 tablet by mouth daily.   Continuous Blood Gluc Receiver (FREESTYLE LIBRE 2 READER) DEVI Use to check glucose as directed   Continuous Blood Gluc Sensor (FREESTYLE LIBRE 2 SENSOR) MISC Change sensor every 14 days   Dulaglutide (TRULICITY) 1.5 MG/0.5ML SOAJ Inject 1.5 mg into the skin once a week.   empagliflozin (JARDIANCE) 25 MG TABS tablet Take 1 tablet (25 mg total) by mouth daily before breakfast.   HYDROcodone-acetaminophen (NORCO/VICODIN) 5-325 MG tablet Take 1 tablet by mouth every 4 (four) hours as needed.   ibuprofen (ADVIL) 800 MG tablet Take 800 mg by mouth every 8 (eight) hours  as needed.   Insulin Aspart FlexPen (NOVOLOG) 100 UNIT/ML 10-16 units tidac.   metFORMIN (GLUCOPHAGE-XR) 500 MG 24 hr tablet Take 1 tablet (500 mg total) by mouth daily with breakfast.   pravastatin (PRAVACHOL) 20 MG tablet Take 1 tablet (20 mg total) by mouth daily.   TRESIBA FLEXTOUCH 200 UNIT/ML FlexTouch Pen Inject 40 Units into the skin at bedtime.   [DISCONTINUED] Dulaglutide (TRULICITY) 1.5 MG/0.5ML SOAJ Inject 1.5 mg into the skin once a week. (Patient not taking: Reported on 07/09/2023)   No facility-administered encounter medications on file as of 07/09/2023.    ALLERGIES: Allergies  Allergen Reactions   Shellfish Allergy Swelling and Anaphylaxis   Lipitor [Atorvastatin] Nausea Only    VACCINATION STATUS: There is no immunization history for the selected administration types on file for this patient.  Diabetes She presents for her follow-up diabetic visit. She has type 2 diabetes mellitus. Onset time: she was diagnosed ta approx age of 33 yrs. Her disease course has been worsening. There are no hypoglycemic associated  symptoms. Pertinent negatives for diabetes include no polydipsia and no polyuria. There are no hypoglycemic complications. Symptoms are worsening. Risk factors for coronary artery disease include dyslipidemia, diabetes mellitus, family history, obesity, hypertension, post-menopausal and sedentary lifestyle. Current diabetic treatments: she is on Lantus 40  units qhs, Novlog 12 -18 units TIDAC. Her weight is fluctuating minimally. She is following a generally unhealthy diet. When asked about meal planning, she reported none. Her home blood glucose trend is increasing steadily. Her breakfast blood glucose range is generally 180-200 mg/dl. Her lunch blood glucose range is generally 180-200 mg/dl. Her dinner blood glucose range is generally 180-200 mg/dl. Her bedtime blood glucose range is generally 180-200 mg/dl. Her overall blood glucose range is 180-200 mg/dl. (She presents with her CGM showing worsening glycemic profile.  She has 55% time in range, 35% Level One hyperglycemia, 10% level 2 hyperglycemia.  She has no hypoglycemia.  Her point-of-care A1c is 8.5% increasing from 7.6%.  Her most recent 14 days average is 182 mg per DL.  )   During her last visit, she was found to have elevated thyroid hormone levels.  She was sent for thyroid uptake and scan, see below.  Review of Systems  Endocrine: Negative for polydipsia and polyuria.    Objective:       07/09/2023   11:16 AM 03/05/2023   10:50 AM 11/28/2022    1:09 PM  Vitals with BMI  Height 5\' 7"  5\' 7"  5\' 7"   Weight 278 lbs 6 oz 279 lbs 13 oz 280 lbs 10 oz  BMI 43.59 43.81 43.94  Systolic 118 130 161  Diastolic 86 76 80  Pulse 68 84 84    BP 118/86   Pulse 68   Ht 5\' 7"  (1.702 m)   Wt 278 lb 6.4 oz (126.3 kg)   BMI 43.60 kg/m   Wt Readings from Last 3 Encounters:  07/09/23 278 lb 6.4 oz (126.3 kg)  03/05/23 279 lb 12.8 oz (126.9 kg)  11/28/22 280 lb 9.6 oz (127.3 kg)     Physical Exam   CMP ( most recent) CMP     Component  Value Date/Time   NA 143 02/26/2023 1010   K 5.3 (H) 02/26/2023 1010   CL 105 02/26/2023 1010   CO2 22 02/26/2023 1010   GLUCOSE 124 (H) 02/26/2023 1010   GLUCOSE 135 (H) 03/26/2015 0540   BUN 17 02/26/2023 1010   CREATININE 1.06 (H) 02/26/2023 1010  CALCIUM 10.0 02/26/2023 1010   PROT 6.9 02/26/2023 1010   ALBUMIN 4.1 02/26/2023 1010   AST 21 02/26/2023 1010   ALT 16 02/26/2023 1010   ALKPHOS 121 02/26/2023 1010   BILITOT 0.5 02/26/2023 1010   GFRNONAA 64 01/14/2020 0955   GFRAA 74 01/14/2020 0955     Diabetic Labs (most recent): Lab Results  Component Value Date   HGBA1C 7.6 (A) 03/05/2023   HGBA1C 8.3 (A) 11/28/2022   HGBA1C 8.9 (A) 07/11/2022       Latest Ref Rng & Units 02/26/2023   10:10 AM 07/04/2022   10:05 AM 10/26/2021   10:17 AM  CMP  Glucose 70 - 99 mg/dL 161  096  045   BUN 8 - 27 mg/dL 17  11  11    Creatinine 0.57 - 1.00 mg/dL 4.09  8.11  9.14   Sodium 134 - 144 mmol/L 143  138  142   Potassium 3.5 - 5.2 mmol/L 5.3  5.5  4.1   Chloride 96 - 106 mmol/L 105  101  103   CO2 20 - 29 mmol/L 22  23  22    Calcium 8.7 - 10.3 mg/dL 78.2  95.6  21.3   Total Protein 6.0 - 8.5 g/dL 6.9  7.1  6.7   Total Bilirubin 0.0 - 1.2 mg/dL 0.5  0.3  0.3   Alkaline Phos 44 - 121 IU/L 121  110  101   AST 0 - 40 IU/L 21  21  23    ALT 0 - 32 IU/L 16  17  17     Lipid Panel     Component Value Date/Time   CHOL 215 (H) 02/26/2023 1010   TRIG 163 (H) 02/26/2023 1010   HDL 40 02/26/2023 1010   CHOLHDL 5.4 (H) 02/26/2023 1010   LDLCALC 145 (H) 02/26/2023 1010   LABVLDL 30 02/26/2023 1010   Thyroid uptake and scan on November 23, 2021.  FINDINGS: Homogeneous uptake in thyroid lobes.   No focal areas of increased or decreased uptake.   4 hour I-131 uptake = 4.9% (normal 5-20%)   24 hour I-131 uptake = 16.5% (normal 10-30%)   IMPRESSION: Normal exam.     Assessment & Plan:   1. DM type 2 causing vascular disease (HCC)   - Laurie Palmer has currently  uncontrolled symptomatic type 2 DM since  78 years of age.  She presents with her CGM showing worsening glycemic profile.  She has 55% time in range, 35% Level One hyperglycemia, 10% level 2 hyperglycemia.  She has no hypoglycemia.  Her point-of-care A1c is 8.5% increasing from 7.6%.  Her most recent 14 days average is 182 mg per DL.    - I had a long discussion with her about the progressive nature of diabetes and the pathology behind its complications. -her diabetes is complicated by obesity/sedentary life, obesity and she remains at a high risk for more acute and chronic complications which include CAD, CVA, CKD, retinopathy, and neuropathy. These are all discussed in detail with her.  - I have counseled her on diet  and weight management  by adopting a carbohydrate restricted/protein rich diet. Patient is encouraged to switch to  unprocessed or minimally processed     complex starch and increased protein intake (animal or plant source), fruits, and vegetables. -  she is advised to stick to a routine mealtimes to eat 3 meals  a day and avoid unnecessary snacks ( to snack only to correct hypoglycemia).  She is benefiting from the slight changes made based on lifestyle medicine.  - she acknowledges that there is a room for improvement in her food and drink choices. - Suggestion is made for her to avoid simple carbohydrates  from her diet including Cakes, Sweet Desserts, Ice Cream, Soda (diet and regular), Sweet Tea, Candies, Chips, Cookies, Store Bought Juices, Alcohol , Artificial Sweeteners,  Coffee Creamer, and "Sugar-free" Products, Lemonade. This will help patient to have more stable blood glucose profile and potentially avoid unintended weight gain.  The following Lifestyle Medicine recommendations according to American College of Lifestyle Medicine  [facility]) were discussed and and offered to patient and she  agrees to start the journey:  A. Whole Foods, Plant-Based Nutrition comprising of  fruits and vegetables, plant-based proteins, whole-grain carbohydrates was discussed in detail with the patient.   A list for source of those nutrients were also provided to the patient.  Patient will use only water or unsweetened tea for hydration. B.  The need to stay away from risky substances including alcohol, smoking; obtaining 7 to 9 hours of restorative sleep, at least 150 minutes of moderate intensity exercise weekly, the importance of healthy social connections,  and stress management techniques were discussed. C.  A full color page of  Calorie density of various food groups per pound showing examples of each food groups was provided to the patient.    - she will be scheduled with Norm Salt, RDN, CDE for diabetes education.  - I have approached her with the following individualized plan to manage  her diabetes and patient agrees:   -In light of her presentation with above target glycemic profile, she will continue to need basal/bolus insulin in order for her to achieve control of diabetes.    Accordingly, she is advised to continue Tresiba 40 units nightly, NovoLog 10-16 units  3 times a day with meals  for pre-meal BG readings of 90-150mg /dl, plus patient specific correction dose for unexpected hyperglycemia above 150mg /dl, associated with strict monitoring of glucose 4 times a day-before meals and at bedtime.  -She is encouraged to continue to use her CGM at all times.    - she is warned not to take insulin without proper monitoring per orders. - Adjustment parameters are given to her for hypo and hyperglycemia in writing. - she is encouraged to call clinic for blood glucose levels less than 70 or above 200 mg /dl. - she is advised to continue metformin 500 mg p.o. daily at breakfast.  -She did not afford Jardiance and Mounjaro.  She would like to stay on Trulicity, prescribed Trulicity 1.5 mg subcutaneously weekly.  Side effects and precautions are discussed with her.  -  Specific targets for  A1c;  LDL, HDL,  and Triglycerides were discussed with the patient.  2) Blood Pressure /Hypertension:  Her blood pressure is controlled to target. She presents with controlled blood pressure.    3) Lipids/Hyperlipidemia:   Review of her recent lipid showed only slight improvement in her LDL to 145. She has gotten better taking her pravastatin, advised to continue pravastatin 20 mg p.o. nightly.     She is also encouraged to engage better with whole food plant-based diet.   4)  Weight/Diet:  Body mass index is 43.6 kg/m.  -   clearly complicating her diabetes care.   she is  a candidate for weight loss. I discussed with her the fact that loss of 5 - 10% of her  current body weight  will have the most impact on her diabetes management.  Exercise, and detailed carbohydrates information provided  -  detailed on discharge instructions.  5) vitamin D deficiency:  She is currently on ongoing ergocalciferol 50,000 units weekly for 12 weeks. -After she finished her high-dose supplement, she will continue vitamin D3 2000 units daily for maintenance. Her labs show persistent mild hypercalcemia of 10.9, however improving PTH of 58.  This is still possibly due to early mild primary hyperparathyroidism.    She will continue to need follow-up with PTH/calcium and 24-hour urine calcium study on subsequent visits, after her vitamin D is corrected.  6) abnormal thyroid function tests: Resolved Her most recent thyroid function tests were within normal limits.  7) Chronic Care/Health Maintenance:  -she  Is non Statin medications and  is encouraged to initiate and continue to follow up with Ophthalmology, Dentist,  Podiatrist at least yearly or according to recommendations, and advised to   stay away from smoking. I have recommended yearly flu vaccine and pneumonia vaccine at least every 5 years; moderate intensity exercise for up to 150 minutes weekly; and  sleep for at least 7 hours a  day.   Her screening ABI was negative for PAD in March 2022.  Her neck study will be in March 2027 or sooner if needed.    - she is  advised to maintain close follow up with Kirstie Peri, MD for primary care needs, as well as her other providers for optimal and coordinated care.   I spent  41  minutes in the care of the patient today including review of labs from CMP, Lipids, Thyroid Function, Hematology (current and previous including abstractions from other facilities); face-to-face time discussing  her blood glucose readings/logs, discussing hypoglycemia and hyperglycemia episodes and symptoms, medications doses, her options of short and long term treatment based on the latest standards of care / guidelines;  discussion about incorporating lifestyle medicine;  and documenting the encounter. Risk reduction counseling performed per USPSTF guidelines to reduce  obesity and cardiovascular risk factors.     Please refer to Patient Instructions for Blood Glucose Monitoring and Insulin/Medications Dosing Guide"  in media tab for additional information. Please  also refer to " Patient Self Inventory" in the Media  tab for reviewed elements of pertinent patient history.  Laurie Palmer participated in the discussions, expressed understanding, and voiced agreement with the above plans.  All questions were answered to her satisfaction. she is encouraged to contact clinic should she have any questions or concerns prior to her return visit.     Follow up plan: - Return in about 4 months (around 11/06/2023) for F/U with Pre-visit Labs, Meter/CGM/Logs, A1c here.  Marquis Lunch, MD [facility] [address] [place], [place] [address] Phone: [phone]  Fax: [phone]    07/09/2023, 12:40 PM  This note was partially dictated with voice recognition software. Similar sounding words can be transcribed inadequately or may not  be corrected upon  review.

## 2023-07-09 NOTE — Patient Instructions (Signed)

## 2023-08-14 HISTORY — PX: BIOPSY ENDOMETRIAL: PRO11

## 2023-09-18 ENCOUNTER — Other Ambulatory Visit: Payer: Self-pay | Admitting: Internal Medicine

## 2023-09-18 DIAGNOSIS — Z1231 Encounter for screening mammogram for malignant neoplasm of breast: Secondary | ICD-10-CM

## 2023-10-16 ENCOUNTER — Ambulatory Visit
Admission: RE | Admit: 2023-10-16 | Discharge: 2023-10-16 | Disposition: A | Discharge status: Home / Self Care | Source: Ambulatory Visit | Attending: Internal Medicine | Admitting: Internal Medicine

## 2023-10-16 DIAGNOSIS — Z1231 Encounter for screening mammogram for malignant neoplasm of breast: Secondary | ICD-10-CM

## 2023-10-30 ENCOUNTER — Other Ambulatory Visit: Payer: Self-pay | Admitting: "Endocrinology

## 2023-11-01 LAB — COMPREHENSIVE METABOLIC PANEL WITH GFR
ALT: 14 IU/L (ref 0–32)
AST: 20 IU/L (ref 0–40)
Albumin: 4 g/dL (ref 3.8–4.8)
Alkaline Phosphatase: 96 IU/L (ref 44–121)
BUN/Creatinine Ratio: 16 (ref 12–28)
BUN: 17 mg/dL (ref 8–27)
Bilirubin Total: 0.4 mg/dL (ref 0.0–1.2)
CO2: 20 mmol/L (ref 20–29)
Calcium: 10.6 mg/dL — ABNORMAL HIGH (ref 8.7–10.3)
Chloride: 103 mmol/L (ref 96–106)
Creatinine, Ser: 1.07 mg/dL — ABNORMAL HIGH (ref 0.57–1.00)
Globulin, Total: 3 g/dL (ref 1.5–4.5)
Glucose: 130 mg/dL — ABNORMAL HIGH (ref 70–99)
Potassium: 5.5 mmol/L — ABNORMAL HIGH (ref 3.5–5.2)
Sodium: 139 mmol/L (ref 134–144)
Total Protein: 7 g/dL (ref 6.0–8.5)
eGFR: 53 mL/min/{1.73_m2} — ABNORMAL LOW (ref >59)

## 2023-11-01 LAB — LIPID PANEL
Chol/HDL Ratio: 6.8 ratio — ABNORMAL HIGH (ref 0.0–4.4)
Cholesterol, Total: 291 mg/dL — ABNORMAL HIGH (ref 100–199)
HDL: 43 mg/dL (ref >39)
LDL Chol Calc (NIH): 213 mg/dL — ABNORMAL HIGH (ref 0–99)
Triglycerides: 182 mg/dL — ABNORMAL HIGH (ref 0–149)
VLDL Cholesterol Cal: 35 mg/dL (ref 5–40)

## 2023-11-01 LAB — TSH: TSH: 1.76 u[IU]/mL (ref 0.450–4.500)

## 2023-11-01 LAB — T4, FREE: Free T4: 1 ng/dL (ref 0.82–1.77)

## 2023-11-07 ENCOUNTER — Encounter: Payer: Self-pay | Admitting: "Endocrinology

## 2023-11-07 ENCOUNTER — Ambulatory Visit (INDEPENDENT_AMBULATORY_CARE_PROVIDER_SITE_OTHER): Payer: Medicare Other | Admitting: "Endocrinology

## 2023-11-07 VITALS — BP 128/80 | HR 80 | Ht 67.0 in | Wt 283.6 lb

## 2023-11-07 DIAGNOSIS — I10 Essential (primary) hypertension: Secondary | ICD-10-CM | POA: Diagnosis not present

## 2023-11-07 DIAGNOSIS — E1159 Type 2 diabetes mellitus with other circulatory complications: Secondary | ICD-10-CM

## 2023-11-07 DIAGNOSIS — E559 Vitamin D deficiency, unspecified: Secondary | ICD-10-CM

## 2023-11-07 DIAGNOSIS — E782 Mixed hyperlipidemia: Secondary | ICD-10-CM | POA: Diagnosis not present

## 2023-11-07 DIAGNOSIS — Z794 Long term (current) use of insulin: Secondary | ICD-10-CM

## 2023-11-07 MED ORDER — PRAVASTATIN SODIUM 20 MG PO TABS: 20.0000 mg | ORAL_TABLET | Freq: Every day | ORAL | 1 refills | Status: DC

## 2023-11-07 NOTE — Progress Notes (Signed)
 11/07/2023, 9:14 AM  Endocrinology follow-up note   Subjective:    Patient ID: Laurie Palmer, female    DOB: 08-31-1945.  Laurie Palmer is being seen in follow-up with her thyroid  uptake and scan.  She was recently seen for the management of her type 2 diabetes.   PMD Maree Isles, MD.   Past Medical History:  Diagnosis Date   Colon cancer [facility])    Elevated carcinoembryonic antigen (CEA)    Gout    Hypercalcemia    Mass of left thigh     Past Surgical History:  Procedure Laterality Date   BIOPSY ENDOMETRIAL  08/2023   CATARACT EXTRACTION Bilateral 01/24/2021   IRRIGATION AND DEBRIDEMENT ABSCESS Left 03/25/2015   Procedure: IRRIGATION AND DEBRIDEMENT ABSCESS LEFT THIGH WITH BIOPSIES AND WOUND VAC PLACEMENT;  Surgeon: Zada JAYSON Hoard, MD;  Location: [facility];  Service: General;  Laterality: Left;    Social History   Socioeconomic History   Marital status: Widowed    Spouse name: Not on file   Number of children: Not on file   Years of education: Not on file   Highest education level: Not on file  Occupational History   Not on file  Tobacco Use   Smoking status: Never   Smokeless tobacco: Never  Vaping Use   Vaping status: Never Used  Substance and Sexual Activity   Alcohol  use: No    Alcohol /week: 0.0 standard drinks of alcohol    Drug use: No   Sexual activity: Not on file  Other Topics Concern   Not on file  Social History Narrative   Not on file   Social Drivers of Health   Financial Resource Strain: Not on file  Food Insecurity: No Food Insecurity (10/03/2023)   Received from [facility]   Hunger Vital Sign    Within the past 12 months, you worried that your food would run out before you got the money to buy more.: Never true    Within the past 12 months, the food you bought just didn't last and you didn't have money to get more.: Never true  Transportation  Needs: No Transportation Needs (10/03/2023)   Received from [facility]   PRAPARE - Transportation    Lack of Transportation (Medical): No    Lack of Transportation (Non-Medical): No  Physical Activity: Not on file  Stress: Not on file  Social Connections: Not on file    Family History  Problem Relation Age of Onset   Diabetes Mother    Hypertension Mother    Hyperlipidemia Mother    Heart attack Mother    Heart failure Mother    Prostate cancer Father    Prostate cancer Brother    Breast cancer Neg Hx     Outpatient Encounter Medications as of 11/07/2023  Medication Sig   aspirin  (ASPIRIN  EC) 81 MG EC tablet Take 81 mg by mouth daily.    Cholecalciferol  50 MCG (2000 UT) CAPS Take 1 capsule (2,000 Units total) by mouth daily with breakfast.   CINNAMON PO Take 1 tablet by mouth daily.   Continuous  Blood Gluc Receiver (FREESTYLE LIBRE 2 READER) DEVI Use to check glucose as directed   Continuous Blood Gluc Sensor (FREESTYLE LIBRE 2 SENSOR) MISC Change sensor every 14 days   Dulaglutide  (TRULICITY ) 1.5 MG/0.5ML SOAJ Inject 1.5 mg into the skin once a week.   HYDROcodone-acetaminophen  (NORCO/VICODIN) 5-325 MG tablet Take 1 tablet by mouth every 4 (four) hours as needed.   ibuprofen (ADVIL) 800 MG tablet Take 800 mg by mouth every 8 (eight) hours as needed.   insulin  aspart (NOVOLOG  FLEXPEN) 100 UNIT/ML FlexPen INJECT 10 TO 16 UNITS SUBCUTANEOUSLY THREE TIMES DAILY BEFORE MEAL(S)   metFORMIN  (GLUCOPHAGE -XR) 500 MG 24 hr tablet Take 1 tablet (500 mg total) by mouth daily with breakfast.   pravastatin  (PRAVACHOL ) 20 MG tablet Take 1 tablet (20 mg total) by mouth at bedtime.   TRESIBA FLEXTOUCH 200 UNIT/ML FlexTouch Pen Inject 40 Units into the skin at bedtime.   [DISCONTINUED] empagliflozin  (JARDIANCE ) 25 MG TABS tablet Take 1 tablet (25 mg total) by mouth daily before breakfast. (Patient not taking: Reported on 11/07/2023)   [DISCONTINUED] pravastatin  (PRAVACHOL ) 20 MG tablet Take 1  tablet (20 mg total) by mouth daily.   No facility-administered encounter medications on file as of 11/07/2023.    ALLERGIES: Allergies  Allergen Reactions   Shellfish Allergy Swelling and Anaphylaxis   Lipitor [Atorvastatin] Nausea Only    VACCINATION STATUS: There is no immunization history for the selected administration types on file for this patient.  Diabetes She presents for her follow-up diabetic visit. She has type 2 diabetes mellitus. Onset time: she was diagnosed ta approx age of 75 yrs. Her disease course has been improving. There are no hypoglycemic associated symptoms. Pertinent negatives for diabetes include no polydipsia and no polyuria. There are no hypoglycemic complications. Symptoms are improving. Risk factors for coronary artery disease include dyslipidemia, diabetes mellitus, family history, obesity, hypertension, post-menopausal and sedentary lifestyle. Her weight is increasing steadily. She is following a generally unhealthy diet. When asked about meal planning, she reported none. Her home blood glucose trend is decreasing steadily. Her breakfast blood glucose range is generally 140-180 mg/dl. Her lunch blood glucose range is generally 140-180 mg/dl. Her dinner blood glucose range is generally 140-180 mg/dl. Her bedtime blood glucose range is generally 140-180 mg/dl. Her overall blood glucose range is 140-180 mg/dl. (She recently underwent endometrial biopsy which revealed surgery.  Her A1c was 9.9% in May 2025.  After she was informed about surgery, she was motivated and started taking her prescribed medication for diabetes which is helping her control glycemia.  Her AGP report shows 61% time range, 29% #1 hyperglycemia, 3% able to hyperglycemia.  She has no hypoglycemia.  Her average blood glucose is 170 mg per DL for the most recent 2 weeks.)     Review of Systems  Endocrine: Negative for polydipsia and polyuria.    Objective:       11/07/2023    8:48 AM 07/09/2023    11:16 AM 03/05/2023   10:50 AM  Vitals with BMI  Height 5' 7 5' 7 5' 7  Weight 283 lbs 10 oz 278 lbs 6 oz 279 lbs 13 oz  BMI 44.41 43.59 43.81  Systolic 128 118 869  Diastolic 80 86 76  Pulse 80 68 84    BP 128/80   Pulse 80   Ht 5' 7 (1.702 m)   Wt 283 lb 9.6 oz (128.6 kg)   BMI 44.42 kg/m   Wt Readings from Last 3 Encounters:  11/07/23  283 lb 9.6 oz (128.6 kg)  07/09/23 278 lb 6.4 oz (126.3 kg)  03/05/23 279 lb 12.8 oz (126.9 kg)     Physical Exam   CMP ( most recent) CMP     Component Value Date/Time   NA 139 10/31/2023 1120   K 5.5 (H) 10/31/2023 1120   CL 103 10/31/2023 1120   CO2 20 10/31/2023 1120   GLUCOSE 130 (H) 10/31/2023 1120   GLUCOSE 135 (H) 03/26/2015 0540   BUN 17 10/31/2023 1120   CREATININE 1.07 (H) 10/31/2023 1120   CALCIUM 10.6 (H) 10/31/2023 1120   PROT 7.0 10/31/2023 1120   ALBUMIN 4.0 10/31/2023 1120   AST 20 10/31/2023 1120   ALT 14 10/31/2023 1120   ALKPHOS 96 10/31/2023 1120   BILITOT 0.4 10/31/2023 1120   GFRNONAA 64 01/14/2020 0955   GFRAA 74 01/14/2020 0955     Diabetic Labs (most recent): Lab Results  Component Value Date   HGBA1C 7.6 (A) 03/05/2023   HGBA1C 8.3 (A) 11/28/2022   HGBA1C 8.9 (A) 07/11/2022       Latest Ref Rng & Units 10/31/2023   11:20 AM 02/26/2023   10:10 AM 07/04/2022   10:05 AM  CMP  Glucose 70 - 99 mg/dL 869  875  847   BUN 8 - 27 mg/dL 17  17  11    Creatinine 0.57 - 1.00 mg/dL 8.92  8.93  9.08   Sodium 134 - 144 mmol/L 139  143  138   Potassium 3.5 - 5.2 mmol/L 5.5  5.3  5.5   Chloride 96 - 106 mmol/L 103  105  101   CO2 20 - 29 mmol/L 20  22  23    Calcium 8.7 - 10.3 mg/dL 89.3  89.9  89.7   Total Protein 6.0 - 8.5 g/dL 7.0  6.9  7.1   Total Bilirubin 0.0 - 1.2 mg/dL 0.4  0.5  0.3   Alkaline Phos 44 - 121 IU/L 96  121  110   AST 0 - 40 IU/L 20  21  21    ALT 0 - 32 IU/L 14  16  17     Lipid Panel     Component Value Date/Time   CHOL 291 (H) 10/31/2023 1120   TRIG 182 (H) 10/31/2023  1120   HDL 43 10/31/2023 1120   CHOLHDL 6.8 (H) 10/31/2023 1120   LDLCALC 213 (H) 10/31/2023 1120   LABVLDL 35 10/31/2023 1120   Thyroid  uptake and scan on November 23, 2021.  FINDINGS: Homogeneous uptake in thyroid  lobes.   No focal areas of increased or decreased uptake.   4 hour I-131 uptake = 4.9% (normal 5-20%)   24 hour I-131 uptake = 16.5% (normal 10-30%)   IMPRESSION: Normal exam.     Assessment & Plan:   1. DM type 2 causing vascular disease (HCC)   - Laurie Palmer has currently uncontrolled symptomatic type 2 DM since  78 years of age.  She recently underwent endometrial biopsy which revealed surgery.  Her A1c was 9.9% in May 2025.  After she was informed about surgery, she was motivated and started taking her prescribed medication for diabetes which is helping her control glycemia.  Her AGP report shows 61% time range, 29% #1 hyperglycemia, 3% able to hyperglycemia.  She has no hypoglycemia.  Her average blood glucose is 170 mg per DL for the most recent 2 weeks.  - I had a long discussion with her about the progressive nature of diabetes  and the pathology behind its complications. -her diabetes is complicated by obesity/sedentary life, obesity and she remains at a high risk for more acute and chronic complications which include CAD, CVA, CKD, retinopathy, and neuropathy. These are all discussed in detail with her.  - I have counseled her on diet  and weight management  by adopting a carbohydrate restricted/protein rich diet. Patient is encouraged to switch to  unprocessed or minimally processed     complex starch and increased protein intake (animal or plant source), fruits, and vegetables. -  she is advised to stick to a routine mealtimes to eat 3 meals  a day and avoid unnecessary snacks ( to snack only to correct hypoglycemia).  She is benefiting from the slight changes made based on lifestyle medicine.  - she acknowledges that there is a room for improvement in  her food and drink choices. - Suggestion is made for her to avoid simple carbohydrates  from her diet including Cakes, Sweet Desserts, Ice Cream, Soda (diet and regular), Sweet Tea, Candies, Chips, Cookies, Store Bought Juices, Alcohol  , Artificial Sweeteners,  Coffee Creamer, and Sugar-free Products, Lemonade. This will help patient to have more stable blood glucose profile and potentially avoid unintended weight gain.  The following Lifestyle Medicine recommendations according to American College of Lifestyle Medicine  [facility]) were discussed and and offered to patient and she  agrees to start the journey:  A. Whole Foods, Plant-Based Nutrition comprising of fruits and vegetables, plant-based proteins, whole-grain carbohydrates was discussed in detail with the patient.   A list for source of those nutrients were also provided to the patient.  Patient will use only water or unsweetened tea for hydration. B.  The need to stay away from risky substances including alcohol , smoking; obtaining 7 to 9 hours of restorative sleep, at least 150 minutes of moderate intensity exercise weekly, the importance of healthy social connections,  and stress management techniques were discussed. C.  A full color page of  Calorie density of various food groups per pound showing examples of each food groups was provided to the patient.   - she will be scheduled with Penny Crumpton, RDN, CDE for diabetes education.  - I have approached her with the following individualized plan to manage  her diabetes and patient agrees:   - Motivated by her upcoming surgery, she has engaged much better than before and achieved glycemic control towards target.   Patient will need this kind of engagement for her to maintain control of diabetes to target for long-term.    Accordingly, she is advised to continue Tresiba 40 units nightly, continue NovoLog  10-16 units  3 times a day with meals  for pre-meal BG readings of 90-150mg /dl, plus  patient specific correction dose for unexpected hyperglycemia above 150mg /dl, associated with strict monitoring of glucose 4 times a day-before meals and at bedtime.  -She is encouraged to continue to use her CGM at all times.    - she is warned not to take insulin  without proper monitoring per orders. - Adjustment parameters are given to her for hypo and hyperglycemia in writing. - she is encouraged to call clinic for blood glucose levels less than 70 or above 200 mg /dl. - she is advised to continue metformin  500 mg p.o. daily at breakfast.  -She did not afford Jardiance . - She has access for Trulicity , advised to continue Trulicity  1.5 mg subcutaneously weekly.    Side effects and precautions are discussed with her. - She is encouraged  to stay engaged in this degree of self-care during and after her surgery.  - Specific targets for  A1c;  LDL, HDL,  and Triglycerides were discussed with the patient.  2) Blood Pressure /Hypertension:  Her blood pressure is controlled to target.  She is not on antihypertensive medications at this time.   3) Lipids/Hyperlipidemia:   Review of her recent lipid showed worsening lipid panel with LDL at 213.  This is one of her biggest risk factors for cardiovascular disease.  Tragically, she did not her pravastatin .  I offered her Repatha intervention, however she would like to delay and promises to start pravastatin  20 mg p.o. nightly.   She is also encouraged to engage better with whole food plant-based diet.   4)  Weight/Diet:  Body mass index is 44.42 kg/m.  -   clearly complicating her diabetes care.   she is  a candidate for weight loss. I discussed with her the fact that loss of 5 - 10% of her  current body weight will have the most impact on her diabetes management.  Exercise, and detailed carbohydrates information provided  -  detailed on discharge instructions.  5) vitamin D  deficiency:  She is currently on ongoing ergocalciferol  50,000 units weekly  for 12 weeks. -After she finished her high-dose supplement, she will continue vitamin D3 2000 units daily for maintenance. Her labs show persistent mild hypercalcemia of 10.9, however improving PTH of 58.  This is still possibly due to early mild primary hyperparathyroidism.    She will continue to need follow-up with PTH/calcium and 24-hour urine calcium study on subsequent visits, after her vitamin D  is corrected.  6) abnormal thyroid  function tests: Resolved Her most recent thyroid  function tests were within normal limits.  7) Chronic Care/Health Maintenance:  -she  Is non Statin medications and  is encouraged to initiate and continue to follow up with Ophthalmology, Dentist,  Podiatrist at least yearly or according to recommendations, and advised to   stay away from smoking. I have recommended yearly flu vaccine and pneumonia vaccine at least every 5 years; moderate intensity exercise for up to 150 minutes weekly; and  sleep for at least 7 hours a day. She is cleared for her planned gynecologic surgery from diabetes point of view.  Her screening ABI was negative for PAD in March 2022.  Her neck study will be in March 2027 or sooner if needed.    - she is  advised to maintain close follow up with Maree Isles, MD for primary care needs, as well as her other providers for optimal and coordinated care.   I spent  40  minutes in the care of the patient today including review of labs from CMP, Lipids, Thyroid  Function, Hematology (current and previous including abstractions from other facilities); face-to-face time discussing  her blood glucose readings/logs, discussing hypoglycemia and hyperglycemia episodes and symptoms, medications doses, her options of short and long term treatment based on the latest standards of care / guidelines;  discussion about incorporating lifestyle medicine;  and documenting the encounter. Risk reduction counseling performed per USPSTF guidelines to reduce  obesity and  cardiovascular risk factors.     Please refer to Patient Instructions for Blood Glucose Monitoring and Insulin /Medications Dosing Guide  in media tab for additional information. Please  also refer to  Patient Self Inventory in the Media  tab for reviewed elements of pertinent patient history.  Laurie Palmer participated in the discussions, expressed understanding, and voiced agreement with the  above plans.  All questions were answered to her satisfaction. she is encouraged to contact clinic should she have any questions or concerns prior to her return visit.   Follow up plan: - Return in about 3 months (around 02/07/2024) for Bring Meter/CGM Device/Logs- A1c in Office.  Ranny Earl, MD [facility] [address] [place], [place] [address] Phone: [phone]  Fax: [phone]    11/07/2023, 9:14 AM  This note was partially dictated with voice recognition software. Similar sounding words can be transcribed inadequately or may not  be corrected upon review.

## 2023-11-07 NOTE — Patient Instructions (Signed)

## 2024-02-14 ENCOUNTER — Ambulatory Visit: Admitting: "Endocrinology

## 2024-03-28 ENCOUNTER — Ambulatory Visit: Admitting: "Endocrinology

## 2024-05-21 ENCOUNTER — Encounter: Payer: Self-pay | Admitting: "Endocrinology

## 2024-05-21 ENCOUNTER — Ambulatory Visit: Admitting: "Endocrinology

## 2024-05-21 VITALS — BP 116/74 | HR 80 | Ht 67.0 in | Wt 238.8 lb

## 2024-05-21 DIAGNOSIS — E559 Vitamin D deficiency, unspecified: Secondary | ICD-10-CM

## 2024-05-21 DIAGNOSIS — E782 Mixed hyperlipidemia: Secondary | ICD-10-CM

## 2024-05-21 DIAGNOSIS — I10 Essential (primary) hypertension: Secondary | ICD-10-CM | POA: Diagnosis not present

## 2024-05-21 DIAGNOSIS — E1159 Type 2 diabetes mellitus with other circulatory complications: Secondary | ICD-10-CM | POA: Diagnosis not present

## 2024-05-21 DIAGNOSIS — Z794 Long term (current) use of insulin: Secondary | ICD-10-CM | POA: Diagnosis not present

## 2024-05-21 LAB — POCT GLYCOSYLATED HEMOGLOBIN (HGB A1C)

## 2024-05-21 MED ORDER — PRAVASTATIN SODIUM 20 MG PO TABS: 20.0000 mg | ORAL_TABLET | Freq: Every day | ORAL | 1 refills | Status: DC

## 2024-05-21 MED ORDER — TRULICITY 1.5 MG/0.5ML ~~LOC~~ SOAJ: 1.5000 mg | SUBCUTANEOUS | 1 refills | Status: AC

## 2024-05-21 NOTE — Patient Instructions (Signed)

## 2024-05-21 NOTE — Progress Notes (Signed)
 "                                                                                     05/21/2024, 5:30 PM  Endocrinology follow-up note   Subjective:    Patient ID: Laurie Palmer, female    DOB: 09-29-1945.  Laurie Palmer is being seen in follow-up with her thyroid  uptake and scan.  She was recently seen for the management of her type 2 diabetes.   PMD Maree Isles, MD.   Past Medical History:  Diagnosis Date   Colon cancer [facility])    Elevated carcinoembryonic antigen (CEA)    Gout    Hypercalcemia    Mass of left thigh     Past Surgical History:  Procedure Laterality Date   ABDOMINAL HYSTERECTOMY  2025   BIOPSY ENDOMETRIAL  08/2023   CATARACT EXTRACTION Bilateral 01/24/2021   IRRIGATION AND DEBRIDEMENT ABSCESS Left 03/25/2015   Procedure: IRRIGATION AND DEBRIDEMENT ABSCESS LEFT THIGH WITH BIOPSIES AND WOUND VAC PLACEMENT;  Surgeon: Zada JAYSON Hoard, MD;  Location: [facility];  Service: General;  Laterality: Left;    Social History   Socioeconomic History   Marital status: Widowed    Spouse name: Not on file   Number of children: Not on file   Years of education: Not on file   Highest education level: Not on file  Occupational History   Not on file  Tobacco Use   Smoking status: Never   Smokeless tobacco: Never  Vaping Use   Vaping status: Never Used  Substance and Sexual Activity   Alcohol  use: No    Alcohol /week: 0.0 standard drinks of alcohol    Drug use: No   Sexual activity: Not on file  Other Topics Concern   Not on file  Social History Narrative   Not on file   Social Drivers of Health   Tobacco Use: Low Risk (05/21/2024)   Patient History    Smoking Tobacco Use: Never    Smokeless Tobacco Use: Never    Passive Exposure: Not on file  Financial Resource Strain: Low Risk (11/09/2023)   Received from [facility]   Overall Financial Resource Strain (CARDIA)    How hard is it for you to pay for the very basics like food, housing, medical care, and  heating?: Not hard at all  Food Insecurity: No Food Insecurity (11/09/2023)   Received from [facility]   Epic    Within the past 12 months, you worried that your food would run out before you got the money to buy more.: Never true    Within the past 12 months, the food you bought just didn't last and you didn't have money to get more.: Never true  Transportation Needs: No Transportation Needs (11/09/2023)   Received from [facility]   PRAPARE - Transportation    Lack of Transportation (Medical): No    Lack of Transportation (Non-Medical): No  Physical Activity: Insufficiently Active (11/09/2023)   Received from [facility]   Exercise Vital Sign    On average, how many days per week do you engage in moderate to strenuous exercise (like a brisk  walk)?: 2 days    On average, how many minutes do you engage in exercise at this level?: 20 min  Stress: No Stress Concern Present (11/09/2023)   Received from [facility] of Occupational Health - Occupational Stress Questionnaire    Do you feel stress - tense, restless, nervous, or anxious, or unable to sleep at night because your mind is troubled all the time - these days?: Only a little  Social Connections: Moderately Integrated (11/09/2023)   Received from [facility]   Social Connection and Isolation Panel    In a typical week, how many times do you talk on the phone with family, friends, or neighbors?: More than three times a week    How often do you get together with friends or relatives?: More than three times a week    How often do you attend church or religious services?: More than 4 times per year    Do you belong to any clubs or organizations such as church groups, unions, fraternal or athletic groups, or school groups?: Yes    How often do you attend meetings of the clubs or organizations you belong to?: More than 4 times per year    Are you married, widowed, divorced, separated, never married, or  living with a partner?: Widowed  Depression (PHQ2-9): Not on file  Alcohol  Screen: Not on file  Housing: Not on file  Utilities: Low Risk (11/09/2023)   Received from [facility]   Utilities    Within the past 12 months, have you been unable to get utilities(heat, electricity) when it was really needed?: No  Health Literacy: Low Risk (11/09/2023)   Received from [facility] Literacy    How often do you need to have someone help you when you read instructions, pamphlets, or other written material from your doctor or pharmacy?: Never    Family History  Problem Relation Age of Onset   Diabetes Mother    Hypertension Mother    Hyperlipidemia Mother    Heart attack Mother    Heart failure Mother    Prostate cancer Father    Prostate cancer Brother    Breast cancer Neg Hx     Outpatient Encounter Medications as of 05/21/2024  Medication Sig   ibuprofen (ADVIL) 600 MG tablet Take 600 mg by mouth every 6 (six) hours as needed.   MAGNESIUM-OXIDE 400 (240 Mg) MG tablet Take 1 tablet by mouth 2 (two) times daily.   ondansetron  (ZOFRAN -ODT) 4 MG disintegrating tablet Take 4 mg by mouth every 8 (eight) hours as needed.   senna (SENOKOT) 8.6 MG tablet Take 1 tablet by mouth 2 (two) times daily.   aspirin  (ASPIRIN  EC) 81 MG EC tablet Take 81 mg by mouth daily.    Cholecalciferol  50 MCG (2000 UT) CAPS Take 1 capsule (2,000 Units total) by mouth daily with breakfast.   CINNAMON PO Take 1 tablet by mouth daily. (Patient not taking: Reported on 05/21/2024)   Continuous Blood Gluc Receiver (FREESTYLE LIBRE 2 READER) DEVI Use to check glucose as directed   Continuous Blood Gluc Sensor (FREESTYLE LIBRE 2 SENSOR) MISC Change sensor every 14 days   Dulaglutide  (TRULICITY ) 1.5 MG/0.5ML SOAJ Inject 1.5 mg into the skin once a week.   HYDROcodone-acetaminophen  (NORCO/VICODIN) 5-325 MG tablet Take 1 tablet by mouth every 4 (four) hours as needed. (Patient not taking: Reported on 05/21/2024)    hydrOXYzine (ATARAX) 25 MG tablet Take 25  mg by mouth daily as needed.   insulin  aspart (NOVOLOG  FLEXPEN) 100 UNIT/ML FlexPen INJECT 10 TO 16 UNITS SUBCUTANEOUSLY THREE TIMES DAILY BEFORE MEAL(S)   metFORMIN  (GLUCOPHAGE -XR) 500 MG 24 hr tablet Take 1 tablet (500 mg total) by mouth daily with breakfast. (Patient not taking: Reported on 05/21/2024)   pravastatin  (PRAVACHOL ) 20 MG tablet Take 1 tablet (20 mg total) by mouth at bedtime.   TRESIBA FLEXTOUCH 200 UNIT/ML FlexTouch Pen Inject 40 Units into the skin at bedtime. (Patient not taking: Inject 40 Units into the skin at bedtime.)   [DISCONTINUED] Dulaglutide  (TRULICITY ) 1.5 MG/0.5ML SOAJ Inject 1.5 mg into the skin once a week. (Patient not taking: Reported on 05/21/2024)   [DISCONTINUED] ibuprofen (ADVIL) 800 MG tablet Take 800 mg by mouth every 8 (eight) hours as needed.   [DISCONTINUED] pravastatin  (PRAVACHOL ) 20 MG tablet Take 1 tablet (20 mg total) by mouth at bedtime.   No facility-administered encounter medications on file as of 05/21/2024.    ALLERGIES: Allergies  Allergen Reactions   Shellfish Allergy Swelling and Anaphylaxis   Lipitor [Atorvastatin] Nausea Only    VACCINATION STATUS: There is no immunization history for the selected administration types on file for this patient.  Diabetes She presents for her follow-up diabetic visit. She has type 2 diabetes mellitus. Onset time: she was diagnosed ta approx age of 33 yrs. Her disease course has been worsening. There are no hypoglycemic associated symptoms. Pertinent negatives for diabetes include no polydipsia and no polyuria. There are no hypoglycemic complications. Symptoms are worsening. Risk factors for coronary artery disease include dyslipidemia, diabetes mellitus, family history, obesity, hypertension, post-menopausal and sedentary lifestyle. Current diabetic treatment includes insulin  injections. Her weight is decreasing steadily. She is following a generally unhealthy diet. When  asked about meal planning, she reported none. Her home blood glucose trend is increasing steadily. Her overall blood glucose range is 140-180 mg/dl. (She recently underwent pelvic surgery complicated by intra-abdominal infections.  She is recovering slowly.  She is accompanied by her sister assisting her in managing diabetes.  She did not bring her CGM north meter to review.  Her point-of-care A1c 7.6%.  She does not report any hypoglycemia.  )     Review of Systems  Endocrine: Negative for polydipsia and polyuria.    Objective:       05/21/2024    3:07 PM 11/07/2023    8:48 AM 07/09/2023   11:16 AM  Vitals with BMI  Height 5' 7 5' 7 5' 7  Weight 238 lbs 13 oz 283 lbs 10 oz 278 lbs 6 oz  BMI 37.39 44.41 43.59  Systolic 116 128 881  Diastolic 74 80 86  Pulse 80 80 68   She ambulates with a walker due to disequilibrium. BP 116/74   Pulse 80   Ht 5' 7 (1.702 m)   Wt 238 lb 12.8 oz (108.3 kg)   BMI 37.40 kg/m   Wt Readings from Last 3 Encounters:  05/21/24 238 lb 12.8 oz (108.3 kg)  11/07/23 283 lb 9.6 oz (128.6 kg)  07/09/23 278 lb 6.4 oz (126.3 kg)     Physical Exam   CMP ( most recent) CMP     Component Value Date/Time   NA 139 10/31/2023 1120   K 5.5 (H) 10/31/2023 1120   CL 103 10/31/2023 1120   CO2 20 10/31/2023 1120   GLUCOSE 130 (H) 10/31/2023 1120   GLUCOSE 135 (H) 03/26/2015 0540   BUN 17 10/31/2023 1120   CREATININE  1.07 (H) 10/31/2023 1120   CALCIUM 10.6 (H) 10/31/2023 1120   PROT 7.0 10/31/2023 1120   ALBUMIN 4.0 10/31/2023 1120   AST 20 10/31/2023 1120   ALT 14 10/31/2023 1120   ALKPHOS 96 10/31/2023 1120   BILITOT 0.4 10/31/2023 1120   GFRNONAA 64 01/14/2020 0955   GFRAA 74 01/14/2020 0955     Diabetic Labs (most recent): Lab Results  Component Value Date   HGBA1C 7.6 (A) 03/05/2023   HGBA1C 8.3 (A) 11/28/2022   HGBA1C 8.9 (A) 07/11/2022       Latest Ref Rng & Units 10/31/2023   11:20 AM 02/26/2023   10:10 AM 07/04/2022   10:05 AM   CMP  Glucose 70 - 99 mg/dL 869  875  847   BUN 8 - 27 mg/dL 17  17  11    Creatinine 0.57 - 1.00 mg/dL 8.92  8.93  9.08   Sodium 134 - 144 mmol/L 139  143  138   Potassium 3.5 - 5.2 mmol/L 5.5  5.3  5.5   Chloride 96 - 106 mmol/L 103  105  101   CO2 20 - 29 mmol/L 20  22  23    Calcium 8.7 - 10.3 mg/dL 89.3  89.9  89.7   Total Protein 6.0 - 8.5 g/dL 7.0  6.9  7.1   Total Bilirubin 0.0 - 1.2 mg/dL 0.4  0.5  0.3   Alkaline Phos 44 - 121 IU/L 96  121  110   AST 0 - 40 IU/L 20  21  21    ALT 0 - 32 IU/L 14  16  17     Lipid Panel     Component Value Date/Time   CHOL 291 (H) 10/31/2023 1120   TRIG 182 (H) 10/31/2023 1120   HDL 43 10/31/2023 1120   CHOLHDL 6.8 (H) 10/31/2023 1120   LDLCALC 213 (H) 10/31/2023 1120   LABVLDL 35 10/31/2023 1120   Thyroid  uptake and scan on November 23, 2021.  FINDINGS: Homogeneous uptake in thyroid  lobes.   No focal areas of increased or decreased uptake.   4 hour I-131 uptake = 4.9% (normal 5-20%)   24 hour I-131 uptake = 16.5% (normal 10-30%)   IMPRESSION: Normal exam.     Assessment & Plan:   1. DM type 2 causing vascular disease (HCC)   - Laurie Palmer has currently uncontrolled symptomatic type 2 DM since  79 years of age.  She recently underwent pelvic surgery complicated by intra-abdominal infections.  She is recovering slowly.  She is accompanied by her sister assisting her in managing diabetes.  She did not bring her CGM north meter to review.  Her point-of-care A1c 7.6%.  She does not report any hypoglycemia.    - I had a long discussion with her about the progressive nature of diabetes and the pathology behind its complications. -her diabetes is complicated by obesity/sedentary life, obesity and she remains at a high risk for more acute and chronic complications which include CAD, CVA, CKD, retinopathy, and neuropathy. These are all discussed in detail with her.  - I have counseled her on diet  and weight management  by adopting  a carbohydrate restricted/protein rich diet. Patient is encouraged to switch to  unprocessed or minimally processed     complex starch and increased protein intake (animal or plant source), fruits, and vegetables. -  she is advised to stick to a routine mealtimes to eat 3 meals  a day and avoid unnecessary snacks (  to snack only to correct hypoglycemia).  She is benefiting from the slight changes made based on lifestyle medicine.  - she acknowledges that there is a room for improvement in her food and drink choices. - Suggestion is made for her to avoid simple carbohydrates  from her diet including Cakes, Sweet Desserts, Ice Cream, Soda (diet and regular), Sweet Tea, Candies, Chips, Cookies, Store Bought Juices, Alcohol  , Artificial Sweeteners,  Coffee Creamer, and Sugar-free Products, Lemonade. This will help patient to have more stable blood glucose profile and potentially avoid unintended weight gain.   - I have approached her with the following individualized plan to manage  her diabetes and patient agrees:   - She is advised to resume Tresiba 30 units nightly, lower NovoLog  to 10-16 units 3 times daily AC for Premeal blood glucose readings above 90 mg per DL.  She is encouraged  to use her CGM continuously.      - she is warned not to take insulin  without proper monitoring per orders. - Adjustment parameters are given to her for hypo and hyperglycemia in writing. - she is encouraged to call clinic for blood glucose levels less than 70 or above 200 mg /dl. - she is advised to hold metformin  for now.  She will benefit from resumption of Trulicity  1.5 mg subcutaneous weekly.   Side effects and precautions are discussed with her. - She is encouraged to stay engaged in this degree of self-care during and after her surgery.  - Specific targets for  A1c;  LDL, HDL,  and Triglycerides were discussed with the patient.  2) Blood Pressure /Hypertension:  Her blood pressure is controlled to target.   She is not on antihypertensive medications at this time.   3) Lipids/Hyperlipidemia:   Review of her recent lipid showed worsening lipid panel with LDL at 213.  This is one of her biggest risk factors for cardiovascular disease.  Tragically, she did not her pravastatin .  I offered her Repatha intervention, however she would like to delay and promises to start pravastatin  20 mg p.o. nightly.    She is also encouraged to engage better with whole food plant-based diet.   4)  Weight/Diet:  Body mass index is 37.4 kg/m.  -   clearly complicating her diabetes care.   she is  a candidate for weight loss. I discussed with her the fact that loss of 5 - 10% of her  current body weight will have the most impact on her diabetes management.  Exercise, and detailed carbohydrates information provided  -  detailed on discharge instructions.  5) vitamin D  deficiency:  She is status post treatment with ergocalciferol  50,000 units weekly for 12 weeks.   She is advised to continue vitamin D3 2000 units daily. 6) abnormal thyroid  function tests: Resolved Her most recent thyroid  function tests were within normal limits.  7) Chronic Care/Health Maintenance:  -she  Is non Statin medications and  is encouraged to initiate and continue to follow up with Ophthalmology, Dentist,  Podiatrist at least yearly or according to recommendations, and advised to   stay away from smoking. I have recommended yearly flu vaccine and pneumonia vaccine at least every 5 years; moderate intensity exercise for up to 150 minutes weekly; and  sleep for at least 7 hours a day. She is cleared for her planned gynecologic surgery from diabetes point of view.  Her screening ABI was negative for PAD in March 2022.  Her neck study will be in March 2027  or sooner if needed.    - she is  advised to maintain close follow up with Maree Isles, MD for primary care needs, as well as her other providers for optimal and coordinated care.   I spent   40  minutes in the care of the patient today including review of labs from CMP, Lipids, Thyroid  Function, Hematology (current and previous including abstractions from other facilities); face-to-face time discussing  her blood glucose readings/logs, discussing hypoglycemia and hyperglycemia episodes and symptoms, medications doses, her options of short and long term treatment based on the latest standards of care / guidelines;  discussion about incorporating lifestyle medicine;  and documenting the encounter. Risk reduction counseling performed per USPSTF guidelines to reduce  obesity and cardiovascular risk factors.     Please refer to Patient Instructions for Blood Glucose Monitoring and Insulin /Medications Dosing Guide  in media tab for additional information. Please  also refer to  Patient Self Inventory in the Media  tab for reviewed elements of pertinent patient history.  Laurie Palmer participated in the discussions, expressed understanding, and voiced agreement with the above plans.  All questions were answered to her satisfaction. she is encouraged to contact clinic should she have any questions or concerns prior to her return visit. Dear Patient: Feel free to review your progress notes.  If you are reviewing this progress note and have questions about the meaning of /or medical terms being used, please make a note and address it at your next follow-up appointment.  Medical notes are meant to be a communication tool between medical professionals and require medical terms to be used for efficiency and insurance approval.    Follow up plan: - Return in about 3 months (around 08/19/2024) for F/U with Pre-visit Labs, Meter/CGM/Logs, A1c here.  Ranny Earl, MD [facility] [address] [place], [place] [address] Phone: [phone]  Fax: [phone]    05/21/2024, 5:30 PM  This note was partially dictated with voice recognition  software. Similar sounding words can be transcribed inadequately or may not  be corrected upon review.  "

## 2024-05-26 ENCOUNTER — Other Ambulatory Visit: Payer: Self-pay

## 2024-05-26 ENCOUNTER — Telehealth: Payer: Self-pay

## 2024-05-26 DIAGNOSIS — E782 Mixed hyperlipidemia: Secondary | ICD-10-CM

## 2024-05-26 MED ORDER — PRAVASTATIN SODIUM 20 MG PO TABS: 20.0000 mg | ORAL_TABLET | Freq: Every day | ORAL | 1 refills | Status: AC

## 2024-05-26 NOTE — Telephone Encounter (Signed)
 Spoke with pt and her sister advising that after Dr. Barbette evaluation of pt's CGM data pt is to decrease her Novolog  SS to 5-12 units TIDAC for BG above 90 and continue Tresiba 30 units at bedtime and add Trulicity  if approved from Patient Assistance per Dr. Lenis. Pt and her sister voiced understanding.

## 2024-05-26 NOTE — Telephone Encounter (Signed)
-----   Message from Ethelle Earl, MD sent at 05/26/2024  2:42 PM EST ----- Kazi Montoro, I want this patient to lower her Novolog  to 5-12 units TIDAC for BG above 90. She can keep Tresiba same at 30 units and add Trulicity  if approved.

## 2024-06-10 ENCOUNTER — Telehealth: Payer: Self-pay

## 2024-06-10 ENCOUNTER — Other Ambulatory Visit (HOSPITAL_COMMUNITY): Payer: Self-pay

## 2024-06-10 NOTE — Telephone Encounter (Signed)
 Pharmacy Patient Advocate Encounter   Received notification from Onbase CMM KEY that prior authorization for Trulicity  1.5MG /0.5ML auto-injectors is required/requested.   Insurance verification completed.   The patient is insured through [facility].   Per test claim: PA required; PA submitted to above mentioned insurance via Latent Key/confirmation #/EOC [id number] Status is pending

## 2024-06-12 ENCOUNTER — Other Ambulatory Visit (HOSPITAL_COMMUNITY): Payer: Self-pay

## 2024-06-12 NOTE — Telephone Encounter (Signed)
 Pharmacy Patient Advocate Encounter  Received notification from [facility] that Prior Authorization for  Trulicity  1.5MG /0.5ML auto-injectors  has been APPROVED from 06/10/24 to (until further notice). Ran test claim, Copay is $591.31. This test claim was processed through [facility]- copay amounts may vary at other pharmacies due to pharmacy/plan contracts, or as the patient moves through the different stages of their insurance plan.   PA #/Case ID/Reference #: [id number]

## 2024-08-26 ENCOUNTER — Ambulatory Visit: Admitting: "Endocrinology
